# Patient Record
Sex: Female | Born: 1940 | Race: White | Hispanic: No | Smoking: Former smoker
Health system: Southern US, Community
[De-identification: ages and names within clinical notes are randomized; demographics above are authoritative.]

## PROBLEM LIST (undated history)

## (undated) DIAGNOSIS — G479 Sleep disorder, unspecified: Secondary | ICD-10-CM

## (undated) DIAGNOSIS — Z853 Personal history of malignant neoplasm of breast: Secondary | ICD-10-CM

## (undated) DIAGNOSIS — I10 Essential (primary) hypertension: Secondary | ICD-10-CM

## (undated) DIAGNOSIS — F419 Anxiety disorder, unspecified: Secondary | ICD-10-CM

## (undated) HISTORY — PX: TONSILLECTOMY: SUR1361

## (undated) HISTORY — DX: Essential (primary) hypertension: I10

## (undated) HISTORY — PX: BREAST LUMPECTOMY: SHX2

## (undated) HISTORY — DX: Anxiety disorder, unspecified: F41.9

## (undated) HISTORY — DX: Sleep disorder, unspecified: G47.9

## (undated) HISTORY — DX: Personal history of malignant neoplasm of breast: Z85.3

## (undated) HISTORY — PX: CHOLECYSTECTOMY: SHX55

---

## 1994-09-23 HISTORY — PX: BREAST LUMPECTOMY: SHX2

## 1997-04-23 HISTORY — PX: LEEP: SHX91

## 2001-09-23 DIAGNOSIS — K5190 Ulcerative colitis, unspecified, without complications: Secondary | ICD-10-CM

## 2001-09-23 HISTORY — DX: Ulcerative colitis, unspecified, without complications: K51.90

## 2001-09-23 HISTORY — PX: INGUINAL HERNIA REPAIR: SUR1180

## 2019-06-29 ENCOUNTER — Encounter: Payer: Self-pay | Admitting: Specialist

## 2019-09-28 ENCOUNTER — Encounter: Payer: Self-pay | Admitting: Internal Medicine

## 2019-09-28 ENCOUNTER — Ambulatory Visit (INDEPENDENT_AMBULATORY_CARE_PROVIDER_SITE_OTHER): Payer: Medicare Other | Admitting: Internal Medicine

## 2019-09-28 ENCOUNTER — Other Ambulatory Visit: Payer: Self-pay

## 2019-09-28 VITALS — BP 124/86 | HR 86 | Temp 98.0°F | Ht 68.0 in | Wt 132.0 lb

## 2019-09-28 DIAGNOSIS — Z7189 Other specified counseling: Secondary | ICD-10-CM | POA: Insufficient documentation

## 2019-09-28 DIAGNOSIS — Z853 Personal history of malignant neoplasm of breast: Secondary | ICD-10-CM

## 2019-09-28 DIAGNOSIS — I10 Essential (primary) hypertension: Secondary | ICD-10-CM | POA: Insufficient documentation

## 2019-09-28 DIAGNOSIS — Z0000 Encounter for general adult medical examination without abnormal findings: Secondary | ICD-10-CM

## 2019-09-28 DIAGNOSIS — R4189 Other symptoms and signs involving cognitive functions and awareness: Secondary | ICD-10-CM | POA: Diagnosis not present

## 2019-09-28 DIAGNOSIS — Z1211 Encounter for screening for malignant neoplasm of colon: Secondary | ICD-10-CM

## 2019-09-28 DIAGNOSIS — G479 Sleep disorder, unspecified: Secondary | ICD-10-CM

## 2019-09-28 LAB — COMPREHENSIVE METABOLIC PANEL
ALT: 7 U/L (ref 0–35)
AST: 22 U/L (ref 0–37)
Albumin: 4.3 g/dL (ref 3.5–5.2)
Alkaline Phosphatase: 64 U/L (ref 39–117)
BUN: 11 mg/dL (ref 6–23)
CO2: 29 mEq/L (ref 19–32)
Calcium: 9.4 mg/dL (ref 8.4–10.5)
Chloride: 99 mEq/L (ref 96–112)
Creatinine, Ser: 0.76 mg/dL (ref 0.40–1.20)
GFR: 73.12 mL/min (ref >60.00)
Glucose, Bld: 93 mg/dL (ref 70–99)
Potassium: 4.8 mEq/L (ref 3.5–5.1)
Sodium: 136 mEq/L (ref 135–145)
Total Bilirubin: 0.4 mg/dL (ref 0.2–1.2)
Total Protein: 6.9 g/dL (ref 6.0–8.3)

## 2019-09-28 LAB — CBC
HCT: 37.6 % (ref 36.0–46.0)
Hemoglobin: 12.6 g/dL (ref 12.0–15.0)
MCHC: 33.3 g/dL (ref 30.0–36.0)
MCV: 94.4 fl (ref 78.0–100.0)
Platelets: 261 10*3/uL (ref 150.0–400.0)
RBC: 3.99 Mil/uL (ref 3.87–5.11)
RDW: 14.2 % (ref 11.5–15.5)
WBC: 6.1 10*3/uL (ref 4.0–10.5)

## 2019-09-28 LAB — T4, FREE: Free T4: 0.98 ng/dL (ref 0.60–1.60)

## 2019-09-28 LAB — VITAMIN B12: Vitamin B-12: 786 pg/mL (ref 211–911)

## 2019-09-28 NOTE — Assessment & Plan Note (Signed)
BP Readings from Last 3 Encounters:  09/28/19 124/86   Not sure of her dose--daughter will send me records of this No apparent problems on the medication  Will check labs today--especially with the cognitive issues

## 2019-09-28 NOTE — Assessment & Plan Note (Signed)
Over 20 years ago Lumpectomy and RT Has decided against continuing mammograms

## 2019-09-28 NOTE — Assessment & Plan Note (Signed)
Sleeps well on the medication Discussed concerns about the mirtazapine affecting her Has been sleeping well here Discussed trying off the mirtazapine---try every other night for 1-2 weeks, and then stop

## 2019-09-28 NOTE — Patient Instructions (Signed)
Please try off the mirtazapine----use it every other night for 1-2 weeks, and then stop it if you are still sleeping well.

## 2019-09-28 NOTE — Assessment & Plan Note (Signed)
Last colon over 10 years ago--higher risk due to breast cancer. Will check FIT Will get records about vaccines No mammograms now

## 2019-09-28 NOTE — Progress Notes (Addendum)
Subjective:    Patient ID: Vicki Randolph, female    DOB: April 06, 1941, 79 y.o.   MRN: [id number]  HPI Here to establish care---with daughter Jeannene Patella  This visit occurred during the SARS-CoV-2 public health emergency.  Safety protocols were in place, including screening questions prior to the visit, additional usage of staff PPE, and extensive cleaning of exam room while observing appropriate contact time as indicated for disinfecting solutions.   Lives at [facility] one of the [place] here a month ago---from [place]  Has HTN since 1987 Has been on lisinopril for many years No orthostatic dizziness Regularly exercise--mostly walks (gym in the past)  Uses the mirtazapine for sleep--not sure of the dose Daughter notes some cognitive changes lately--"not connecting the dots" as well  Past breast cancer Lumpectomy and RT No mammograms in past 2-3 years Colonoscopy about 15 years ago  Current Outpatient Medications on File Prior to Visit  Medication Sig Dispense Refill  . lisinopril (ZESTRIL) 30 MG tablet Take 30 mg by mouth daily.    Marland Kitchen MIRTAZAPINE PO Take by mouth. 1/2 tablet at bedtime    . Multiple Vitamin (MULTIVITAMIN) capsule Take 1 capsule by mouth daily.     No current facility-administered medications on file prior to visit.    No Known Allergies  Past Medical History:  Diagnosis Date  . Anxiety   . History of breast cancer   . Hypertension   . Sleep disturbance   . Ulcerative colitis ([place]) 2003    Past Surgical History:  Procedure Laterality Date  . BREAST LUMPECTOMY Right 1990's  . BREAST LUMPECTOMY Right 1996  . CHOLECYSTECTOMY    . INGUINAL HERNIA REPAIR  2003   strangulated  . LEEP  04/1997   with cervical dilation  . TONSILLECTOMY      Family History  Problem Relation Age of Onset  . Heart disease Mother   . Alcohol abuse Father   . Alcohol abuse Sister   . Cancer Sister   . COPD Sister     Social History   Socioeconomic History    . Marital status: Divorced    Spouse name: Not on file  . Number of children: 3  . Years of education: Not on file  . Highest education level: Not on file  Occupational History  . Occupation: Homemaker  Tobacco Use  . Smoking status: Former Smoker    Packs/day: 1.00    Years: 30.00    Pack years: 30.00    Types: Cigarettes    Start date: 61    Quit date: 1980    Years since quitting: 41.3  . Smokeless tobacco: Never Used  Substance and Sexual Activity  . Alcohol use: Not on file  . Drug use: Not on file  . Sexual activity: Not on file  Other Topics Concern  . Not on file  Social History Narrative   Divorced many years ago   Worked briefly at several jobs--mostly homemaker   3 children      Has living will   Children share [facility] POA   Would accept resuscitation but no prolonged ventilation or tube feeds   Social Determinants of Health   Financial Resource Strain:   . Difficulty of Paying Living Expenses:   Food Insecurity:   . Worried About Charity fundraiser in the Last Year:   . Arboriculturist in the Last Year:   Transportation Needs:   . Film/video editor (Medical):   Marland Kitchen Lack  of Transportation (Non-Medical):   Physical Activity:   . Days of Exercise per Week:   . Minutes of Exercise per Session:   Stress:   . Feeling of Stress :   Social Connections:   . Frequency of Communication with Friends and Family:   . Frequency of Social Gatherings with Friends and Family:   . Attends Religious Services:   . Active Member of Clubs or Organizations:   . Attends Banker Meetings:   Marland Kitchen Marital Status:   Intimate Partner Violence:   . Fear of Current or Ex-Partner:   . Emotionally Abused:   Marland Kitchen Physically Abused:   . Sexually Abused:     Review of Systems Appetite is fine Weight is down now from her usual Hearing seems not as good per daughter Wears seat belt Teeth are okay    Objective:   Physical Exam  Constitutional: She is oriented to  person, place, and time. She appears well-developed. No distress.  Neck: No thyromegaly present.  Cardiovascular: Normal rate, regular rhythm, normal heart sounds and intact distal pulses. Exam reveals no gallop.  No murmur heard. Respiratory: Effort normal and breath sounds normal. No respiratory distress. She has no wheezes. She has no rales.  GI: Soft. There is no abdominal tenderness.  Musculoskeletal:        General: No tenderness or edema.  Lymphadenopathy:    She has no cervical adenopathy.  Neurological: She is alert and oriented to person, place, and time.  President--- "Trump, ?" [id number] Recall 2/3  Skin: No rash noted. No erythema.  Psychiatric: She has a normal mood and affect. Her behavior is normal.           Assessment & Plan:

## 2019-09-28 NOTE — Assessment & Plan Note (Addendum)
Having some troubles per mom Patient is somewhat defensive--but does notice minor issues (and has trouble remembering doses, etc) Clearly has had some stress with move No regular depression  Doesn't feel anxious now--did have in the past Needs hearing evaluation--and aides prn Will recheck in 3-6 months

## 2019-11-05 ENCOUNTER — Encounter: Payer: Self-pay | Admitting: Internal Medicine

## 2020-01-12 ENCOUNTER — Encounter: Payer: Self-pay | Admitting: Internal Medicine

## 2020-01-12 ENCOUNTER — Other Ambulatory Visit: Payer: Self-pay

## 2020-01-12 ENCOUNTER — Ambulatory Visit (INDEPENDENT_AMBULATORY_CARE_PROVIDER_SITE_OTHER): Payer: Medicare Other | Admitting: Internal Medicine

## 2020-01-12 DIAGNOSIS — I10 Essential (primary) hypertension: Secondary | ICD-10-CM

## 2020-01-12 DIAGNOSIS — F39 Unspecified mood [affective] disorder: Secondary | ICD-10-CM | POA: Diagnosis not present

## 2020-01-12 DIAGNOSIS — R4189 Other symptoms and signs involving cognitive functions and awareness: Secondary | ICD-10-CM | POA: Diagnosis not present

## 2020-01-12 NOTE — Assessment & Plan Note (Signed)
Now with depression since cutting back on the mirtazapine Will go back to the 30mg  at night Recheck 1 month Consider adding another agent if persists Restart exercise Needs social engagement

## 2020-01-12 NOTE — Assessment & Plan Note (Signed)
BP Readings from Last 3 Encounters:  01/12/20 (!) 144/86  09/28/19 124/86   Seems to be okay with just the low dose lisinopril Will continue this

## 2020-01-12 NOTE — Patient Instructions (Signed)
Please go back up to a full mirtazapine 30mg  every night

## 2020-01-12 NOTE — Progress Notes (Signed)
Subjective:    Patient ID: Vicki Randolph, female    DOB: 1941-02-08, 79 y.o.   MRN: [id number]  HPI Here with daughter Jeannene Patella for review of cognitive issues This visit occurred during the SARS-CoV-2 public health emergency.  Safety protocols were in place, including screening questions prior to the visit, additional usage of staff PPE, and extensive cleaning of exam room while observing appropriate contact time as indicated for disinfecting solutions.   She feels now that she is depressed Not really exercising much Has had past depression---treated in the past (likely 30 or more years ago) Notes anhedonia Daily sad feeling--seems more since she moved  Not hopeless but cries at times Thinks about all her lost relatives Thinks about dying but no suicidal thoughts  Sleeps well on the mirtazapine Has been taking a full mirtazapine before--but we cut that back  She doesn't see any cognitive changes Pam notes that she is not any better  Current Outpatient Medications on File Prior to Visit  Medication Sig Dispense Refill  . lisinopril (ZESTRIL) 10 MG tablet Take 10 mg by mouth daily.    . mirtazapine (REMERON) 30 MG tablet Take 15 mg by mouth at bedtime.    . Multiple Vitamin (MULTIVITAMIN) capsule Take 1 capsule by mouth daily.     No current facility-administered medications on file prior to visit.    No Known Allergies  Past Medical History:  Diagnosis Date  . Anxiety   . History of breast cancer   . Hypertension   . Sleep disturbance   . Ulcerative colitis ([place]) 2003    Past Surgical History:  Procedure Laterality Date  . BREAST LUMPECTOMY Right 1990's  . BREAST LUMPECTOMY Right 1996  . CHOLECYSTECTOMY    . INGUINAL HERNIA REPAIR  2003   strangulated  . LEEP  04/1997   with cervical dilation  . TONSILLECTOMY      Family History  Problem Relation Age of Onset  . Heart disease Mother   . Alcohol abuse Father   . Alcohol abuse Sister   . Cancer Sister   .  COPD Sister     Social History   Socioeconomic History  . Marital status: Divorced    Spouse name: Not on file  . Number of children: 3  . Years of education: Not on file  . Highest education level: Not on file  Occupational History  . Occupation: Homemaker  Tobacco Use  . Smoking status: Former Smoker    Packs/day: 1.00    Years: 30.00    Pack years: 30.00    Types: Cigarettes    Start date: 58    Quit date: 1980    Years since quitting: 41.3  . Smokeless tobacco: Never Used  Substance and Sexual Activity  . Alcohol use: Not on file  . Drug use: Not on file  . Sexual activity: Not on file  Other Topics Concern  . Not on file  Social History Narrative   Divorced many years ago   Worked briefly at several jobs--mostly homemaker   3 children      Has living will   Children share [facility] POA   Would accept resuscitation but no prolonged ventilation or tube feeds   Social Determinants of Health   Financial Resource Strain:   . Difficulty of Paying Living Expenses:   Food Insecurity:   . Worried About Charity fundraiser in the Last Year:   . [place] in the Last Year:  Transportation Needs:   . Freight forwarder (Medical):   Marland Kitchen Lack of Transportation (Non-Medical):   Physical Activity:   . Days of Exercise per Week:   . Minutes of Exercise per Session:   Stress:   . Feeling of Stress :   Social Connections:   . Frequency of Communication with Friends and Family:   . Frequency of Social Gatherings with Friends and Family:   . Attends Religious Services:   . Active Member of Clubs or Organizations:   . Attends Banker Meetings:   Marland Kitchen Marital Status:   Intimate Partner Violence:   . Fear of Current or Ex-Partner:   . Emotionally Abused:   Marland Kitchen Physically Abused:   . Sexually Abused:    Review of Systems Appetite is "fine"---weight down slightly Not cooking much (she just doesn't) Has hearing aides but forgets to put them in      Objective:   Physical Exam  Constitutional: No distress.  Psychiatric:  Mild depression           Assessment & Plan:

## 2020-01-12 NOTE — Assessment & Plan Note (Signed)
Persists but not clearly worse May want to consider brain MRI and neurology evaluation Will hold off on that

## 2020-02-18 ENCOUNTER — Ambulatory Visit: Payer: Medicare Other | Admitting: Internal Medicine

## 2020-02-22 ENCOUNTER — Ambulatory Visit: Payer: Medicare Other | Admitting: Internal Medicine

## 2020-02-23 ENCOUNTER — Other Ambulatory Visit: Payer: Self-pay

## 2020-02-23 ENCOUNTER — Ambulatory Visit: Payer: Medicare Other | Admitting: Internal Medicine

## 2020-03-01 ENCOUNTER — Encounter: Payer: Self-pay | Admitting: Internal Medicine

## 2020-03-01 ENCOUNTER — Other Ambulatory Visit: Payer: Self-pay

## 2020-03-01 ENCOUNTER — Ambulatory Visit (INDEPENDENT_AMBULATORY_CARE_PROVIDER_SITE_OTHER): Payer: Medicare Other | Admitting: Internal Medicine

## 2020-03-01 DIAGNOSIS — F39 Unspecified mood [affective] disorder: Secondary | ICD-10-CM

## 2020-03-01 DIAGNOSIS — G479 Sleep disorder, unspecified: Secondary | ICD-10-CM | POA: Diagnosis not present

## 2020-03-01 NOTE — Progress Notes (Signed)
Subjective:    Patient ID: Vicki Randolph, female    DOB: [birth year], 79 y.o.   MRN: [id number]  HPI Here with daughter for follow up of depression and sleep problems This visit occurred during the SARS-CoV-2 public health emergency.  Safety protocols were in place, including screening questions prior to the visit, additional usage of staff PPE, and extensive cleaning of exam room while observing appropriate contact time as indicated for disinfecting solutions.  Still not sleeping well Daughter "is the only thing in the world to be grateful for" Regular bedtime is 10PM---now pushed to 11PM Usually awakens 2:30-3AM and is up---- but stays in bed till 7AM No napping Some daytime tiredness---but no sleep pressure  She is unclear about whether she is taking 1/2 or full mirtazapine She doesn't note depression like last time  She notes some forgetfulness now  Current Outpatient Medications on File Prior to Visit  Medication Sig Dispense Refill  . lisinopril (ZESTRIL) 10 MG tablet Take 10 mg by mouth daily.    . mirtazapine (REMERON) 30 MG tablet Take 30 mg by mouth at bedtime. Pt alternates with 1/2 or whole tablet    . Multiple Vitamin (MULTIVITAMIN) capsule Take 1 capsule by mouth daily.     No current facility-administered medications on file prior to visit.    No Known Allergies  Past Medical History:  Diagnosis Date  . Anxiety   . History of breast cancer   . Hypertension   . Sleep disturbance   . Ulcerative colitis ([facility]) 2003    Past Surgical History:  Procedure Laterality Date  . BREAST LUMPECTOMY Right 1990's  . BREAST LUMPECTOMY Right 1996  . CHOLECYSTECTOMY    . INGUINAL HERNIA REPAIR  2003   strangulated  . LEEP  04/1997   with cervical dilation  . TONSILLECTOMY      Family History  Problem Relation Age of Onset  . Heart disease Mother   . Alcohol abuse Father   . Alcohol abuse Sister   . Cancer Sister   . COPD Sister     Social History    Socioeconomic History  . Marital status: Divorced    Spouse name: Not on file  . Number of children: 3  . Years of education: Not on file  . Highest education level: Not on file  Occupational History  . Occupation: Homemaker  Tobacco Use  . Smoking status: Former Smoker    Packs/day: 1.00    Years: 30.00    Pack years: 30.00    Types: Cigarettes    Start date: 48    Quit date: 1980    Years since quitting: 41.4  . Smokeless tobacco: Never Used  Substance and Sexual Activity  . Alcohol use: Not on file  . Drug use: Not on file  . Sexual activity: Not on file  Other Topics Concern  . Not on file  Social History Narrative   Divorced many years ago   Worked briefly at several jobs--mostly homemaker   3 children      Has living will   Children share [facility] POA   Would accept resuscitation but no prolonged ventilation or tube feeds   Social Determinants of Health   Financial Resource Strain:   . Difficulty of Paying Living Expenses:   Food Insecurity:   . Worried About Programme researcher, broadcasting/film/video in the Last Year:   . [employer] of Food in the Last Year:   Transportation Needs:   . Lack of  Transportation (Medical):   Marland Kitchen Lack of Transportation (Non-Medical):   Physical Activity:   . Days of Exercise per Week:   . Minutes of Exercise per Session:   Stress:   . Feeling of Stress :   Social Connections:   . Frequency of Communication with Friends and Family:   . Frequency of Social Gatherings with Friends and Family:   . Attends Religious Services:   . Active Member of Clubs or Organizations:   . Attends Archivist Meetings:   Marland Kitchen Marital Status:   Intimate Partner Violence:   . Fear of Current or Ex-Partner:   . Emotionally Abused:   Marland Kitchen Physically Abused:   . Sexually Abused:    Review of Systems Appetite is okay Weight is stable No snoring or apnea No caffeine after morning coffee No edema Nocturia x 2-3--usually can get back to sleep    Objective:    Physical Exam  Constitutional: She appears well-developed. No distress.  Psychiatric: She has a normal mood and affect. Her behavior is normal.           Assessment & Plan:

## 2020-03-01 NOTE — Assessment & Plan Note (Addendum)
No daytime somnolence but some energy issues Discussed sleep hygiene---- try compressing sleep (get up 7AM but go to sleep later) Now will start exercise at the gym and more social interaction Going to the mass service at [facility] For now, will avoid other medications  Does have trazodone from her past physician She doesn't remember if that helped Okay to try that

## 2020-03-01 NOTE — Assessment & Plan Note (Signed)
Not really feeling depressed now Will continue the mirtazapine

## 2020-03-01 NOTE — Patient Instructions (Signed)
You can try the trazodone 50mg  at bedtime to see if that helps. Go ahead and start in the gym and increase your social interactions. Continue to get up at 7AM every morning. Stop listening to the news at night--music is okay Go to bed later--like midnight (if you sleep all the way to 7AM, you can start going to bed earlier)

## 2020-06-08 ENCOUNTER — Ambulatory Visit: Payer: Medicare Other | Admitting: Internal Medicine

## 2020-10-11 ENCOUNTER — Telehealth: Payer: Self-pay

## 2020-10-11 NOTE — Telephone Encounter (Signed)
Called and left voicemail for patient to call office back. Dr. Alphonsus Sias was wanting to follow-up and see how she was doing.

## 2020-10-12 ENCOUNTER — Ambulatory Visit (INDEPENDENT_AMBULATORY_CARE_PROVIDER_SITE_OTHER): Payer: Medicare Other | Admitting: Internal Medicine

## 2020-10-12 ENCOUNTER — Other Ambulatory Visit: Payer: Self-pay

## 2020-10-12 ENCOUNTER — Encounter: Payer: Self-pay | Admitting: Internal Medicine

## 2020-10-12 VITALS — BP 136/84 | HR 87 | Temp 97.6°F | Ht 68.0 in | Wt 133.0 lb

## 2020-10-12 DIAGNOSIS — F39 Unspecified mood [affective] disorder: Secondary | ICD-10-CM

## 2020-10-12 DIAGNOSIS — R413 Other amnesia: Secondary | ICD-10-CM

## 2020-10-12 DIAGNOSIS — G3184 Mild cognitive impairment, so stated: Secondary | ICD-10-CM | POA: Insufficient documentation

## 2020-10-12 DIAGNOSIS — G459 Transient cerebral ischemic attack, unspecified: Secondary | ICD-10-CM | POA: Insufficient documentation

## 2020-10-12 NOTE — Patient Instructions (Signed)
Please start taking a coated 81mg  aspirin daily. We will reconsider this after the tests.

## 2020-10-12 NOTE — Assessment & Plan Note (Addendum)
Period of 2-3 hours of expressive aphasia Clear but illogical speech that cleared No deficit now Will check MRI and carotids She is reluctant to take any medication for this--but will start ASA 81mg  for now Will plan surgical evaluation if major carotid obstruction

## 2020-10-12 NOTE — Progress Notes (Signed)
Subjective:    Patient ID: Vicki Randolph, female    DOB: 02/13/1941, 80 y.o.   MRN: [id number]  HPI Here with daughter Elita Quick due to concerns about cognitive changes This visit occurred during the SARS-CoV-2 public health emergency.  Safety protocols were in place, including screening questions prior to the visit, additional usage of staff PPE, and extensive cleaning of exam room while observing appropriate contact time as indicated for disinfecting solutions.   Daughter on the phone with her 2 evenings ago She was saying words "that didn't connect in any coherent way" Had been fine on the phone that morning Daughter and her husband drove over to check on her (patient doesn't like intrusions so she didn't call EMTs) She was on speaker phone and SIL noticed also Got there about 30 minutes later Still not speaking correctly-but was some better  Abnormal speech was also noted by a granddaughter This happened soon before the call with daughter Kept saying "I'm paying attention....you know, high school, yeah paying attention"---etc She was upset with daughter for showing up---worries because daughter has mentioned concerns with dementia--but this was different Took her home for the night---and seemed fine  Ongoing issues with forgetfulness--but this was very different  No focal weakness No facial droop Did decide to stop driving some time ago  Still depressed at times Goes to mass weekly--with group of women Services at [facility] also--and spiritual discussion group To lunch with women at times  Still having sleep problems Continues on the mirtazapine  Slept better at daughter's house  Current Outpatient Medications on File Prior to Visit  Medication Sig Dispense Refill  . lisinopril (ZESTRIL) 10 MG tablet Take 10 mg by mouth daily.    . mirtazapine (REMERON) 30 MG tablet Take 30 mg by mouth at bedtime. Pt alternates with 1/2 or whole tablet    . Multiple Vitamin  (MULTIVITAMIN) capsule Take 1 capsule by mouth daily.     No current facility-administered medications on file prior to visit.    No Known Allergies  Past Medical History:  Diagnosis Date  . Anxiety   . History of breast cancer   . Hypertension   . Sleep disturbance   . Ulcerative colitis ([facility]) 2003    Past Surgical History:  Procedure Laterality Date  . BREAST LUMPECTOMY Right 1990's  . BREAST LUMPECTOMY Right 1996  . CHOLECYSTECTOMY    . INGUINAL HERNIA REPAIR  2003   strangulated  . LEEP  04/1997   with cervical dilation  . TONSILLECTOMY      Family History  Problem Relation Age of Onset  . Heart disease Mother   . Alcohol abuse Father   . Alcohol abuse Sister   . Cancer Sister   . COPD Sister     Social History   Socioeconomic History  . Marital status: Divorced    Spouse name: Not on file  . Number of children: 3  . Years of education: Not on file  . Highest education level: Not on file  Occupational History  . Occupation: Homemaker  Tobacco Use  . Smoking status: Former Smoker    Packs/day: 1.00    Years: 30.00    Pack years: 30.00    Types: Cigarettes    Start date: 56    Quit date: 1980    Years since quitting: 42.0  . Smokeless tobacco: Never Used  Substance and Sexual Activity  . Alcohol use: Not on file  . Drug use: Not on file  .  Sexual activity: Not on file  Other Topics Concern  . Not on file  Social History Narrative   Divorced many years ago   Worked briefly at several jobs--mostly homemaker   3 children      Has living will   Children share [facility] POA   Would accept resuscitation but no prolonged ventilation or tube feeds   Social Determinants of Health   Financial Resource Strain: Not on file  Food Insecurity: Not on file  Transportation Needs: Not on file  Physical Activity: Not on file  Stress: Not on file  Social Connections: Not on file  Intimate Partner Violence: Not on file   Review of Systems Not sick No  fevers No dysuria Eating well---goes weekly to shop with [employer] Doing housework No cough or SOB     Objective:   Physical Exam Constitutional:      Appearance: Normal appearance.  Cardiovascular:     Rate and Rhythm: Normal rate and regular rhythm.     Heart sounds: No murmur heard. No gallop.   Pulmonary:     Effort: Pulmonary effort is normal.     Breath sounds: Normal breath sounds. No wheezing or rales.  Musculoskeletal:     Cervical back: Neck supple.  Lymphadenopathy:     Cervical: No cervical adenopathy.  Neurological:     Mental Status: She is alert and oriented to person, place, and time.     Cranial Nerves: Cranial nerves are intact.     Motor: Motor function is intact.     Coordination: Coordination is intact. Romberg sign negative. Coordination normal.     Gait: Gait is intact.  Psychiatric:     Comments: Resistant to daughter's descriptions and seems to have some degree of denial            Assessment & Plan:

## 2020-10-12 NOTE — Assessment & Plan Note (Signed)
Ongoing mild depression Will continue the mirtazapine

## 2020-10-12 NOTE — Assessment & Plan Note (Signed)
Has had some cognitive changes over time Will check labs

## 2020-10-13 LAB — COMPREHENSIVE METABOLIC PANEL
ALT: 4 U/L (ref 0–35)
AST: 15 U/L (ref 0–37)
Albumin: 4.3 g/dL (ref 3.5–5.2)
Alkaline Phosphatase: 68 U/L (ref 39–117)
BUN: 17 mg/dL (ref 6–23)
CO2: 29 mEq/L (ref 19–32)
Calcium: 9.3 mg/dL (ref 8.4–10.5)
Chloride: 99 mEq/L (ref 96–112)
Creatinine, Ser: 0.7 mg/dL (ref 0.40–1.20)
GFR: 81.01 mL/min (ref >60.00)
Glucose, Bld: 93 mg/dL (ref 70–99)
Potassium: 5.1 mEq/L (ref 3.5–5.1)
Sodium: 134 mEq/L — ABNORMAL LOW (ref 135–145)
Total Bilirubin: 0.3 mg/dL (ref 0.2–1.2)
Total Protein: 6.5 g/dL (ref 6.0–8.3)

## 2020-10-13 LAB — VITAMIN B12: Vitamin B-12: 579 pg/mL (ref 211–911)

## 2020-10-13 LAB — LIPID PANEL
Cholesterol: 222 mg/dL — ABNORMAL HIGH (ref 0–200)
HDL: 55.9 mg/dL (ref >39.00)
LDL Cholesterol: 140 mg/dL — ABNORMAL HIGH (ref 0–99)
NonHDL: 165.8
Total CHOL/HDL Ratio: 4
Triglycerides: 131 mg/dL (ref 0.0–149.0)
VLDL: 26.2 mg/dL (ref 0.0–40.0)

## 2020-10-13 LAB — CBC
HCT: 36.1 % (ref 36.0–46.0)
Hemoglobin: 12.1 g/dL (ref 12.0–15.0)
MCHC: 33.4 g/dL (ref 30.0–36.0)
MCV: 93.4 fl (ref 78.0–100.0)
Platelets: 305 10*3/uL (ref 150.0–400.0)
RBC: 3.87 Mil/uL (ref 3.87–5.11)
RDW: 13.8 % (ref 11.5–15.5)
WBC: 7.4 10*3/uL (ref 4.0–10.5)

## 2020-10-13 LAB — T4, FREE: Free T4: 0.89 ng/dL (ref 0.60–1.60)

## 2020-10-13 NOTE — Addendum Note (Signed)
Addended by: Maisie Fus on: 10/13/2020 05:16 PM   Modules accepted: Orders

## 2020-10-14 ENCOUNTER — Ambulatory Visit (HOSPITAL_COMMUNITY): Admission: RE | Admit: 2020-10-14 | Payer: Medicare Other | Source: Ambulatory Visit

## 2020-10-16 ENCOUNTER — Telehealth: Payer: Self-pay

## 2020-10-16 NOTE — Telephone Encounter (Signed)
After I called and left a message found out that per daughter they want to wait on MRI and proceed with [place].  I scheduled patient with [facility] in [place] for tomorrow-10/17/20 at 1 pm to arrive at 12:45 pm. Need to notify daughter still.  Address: [address], [place], [place] [address]  Phone: [phone]

## 2020-10-16 NOTE — Telephone Encounter (Signed)
Okay Will await the report tomorrow

## 2020-10-16 NOTE — Telephone Encounter (Signed)
Spoke with Daughter - Rinaldo Cloud - she is aware of Carotid US appt for patient scheduled on 10/17/20 at [facility] [address].  Will send to Dr Alphonsus Sias as [place] Childes that were able to get her scheduled.   Nothing further needed.

## 2020-10-16 NOTE — Addendum Note (Signed)
Addended by: Maisie Fus on: 10/16/2020 11:58 AM   Modules accepted: Orders

## 2020-10-16 NOTE — Telephone Encounter (Signed)
Left message for patient's daughter, Rinaldo Cloud, to call back to discuss when she can take patient for MRI and Carotid Ultrasound before I call and schedule.

## 2020-10-17 ENCOUNTER — Ambulatory Visit (INDEPENDENT_AMBULATORY_CARE_PROVIDER_SITE_OTHER): Payer: Medicare Other

## 2020-10-17 ENCOUNTER — Other Ambulatory Visit: Payer: Self-pay

## 2020-10-17 DIAGNOSIS — G459 Transient cerebral ischemic attack, unspecified: Secondary | ICD-10-CM | POA: Diagnosis not present

## 2020-10-17 NOTE — Telephone Encounter (Signed)
Pt's daughter called to check status of MRI, spoke with [facility] and they were under the impression she wanted to hold off on MRI. Daughter said they didn't want to hold off it was just daughter had to attend a funeral on the Saturday they were trying to schedule and she though they were going to pick another day.  Checked with [facility] and she was on the phone so note sent, I advise daughter Rinaldo Cloud [facility] will call back to discuss MRI. CB # [id number], she said she had multiple days she can't take pt due to schedule so when [facility] calls she will just tell her the days that she can take pt. Daughter has a work Armed forces technical officer at [employer] so if she can't call back before then she requested [place] to call tomorrow

## 2020-10-18 NOTE — Telephone Encounter (Signed)
Spoke with patient's daughter Elita Quick, MRI scheduled based on her available dates and times. Also provided phone number for scheduling to [facility] in case she needs to reschedule.

## 2020-10-27 ENCOUNTER — Other Ambulatory Visit: Payer: Self-pay

## 2020-10-27 ENCOUNTER — Other Ambulatory Visit: Payer: Self-pay | Admitting: Internal Medicine

## 2020-10-27 ENCOUNTER — Ambulatory Visit
Admission: RE | Admit: 2020-10-27 | Discharge: 2020-10-27 | Disposition: A | Discharge status: Home / Self Care | Payer: Medicare Other | Source: Ambulatory Visit | Attending: Internal Medicine | Admitting: Internal Medicine

## 2020-10-27 DIAGNOSIS — R413 Other amnesia: Secondary | ICD-10-CM | POA: Diagnosis present

## 2020-10-27 DIAGNOSIS — G459 Transient cerebral ischemic attack, unspecified: Secondary | ICD-10-CM | POA: Diagnosis present

## 2020-11-01 ENCOUNTER — Ambulatory Visit (INDEPENDENT_AMBULATORY_CARE_PROVIDER_SITE_OTHER): Payer: Medicare Other | Admitting: Internal Medicine

## 2020-11-01 ENCOUNTER — Encounter: Payer: Self-pay | Admitting: Internal Medicine

## 2020-11-01 ENCOUNTER — Other Ambulatory Visit: Payer: Self-pay

## 2020-11-01 DIAGNOSIS — I10 Essential (primary) hypertension: Secondary | ICD-10-CM

## 2020-11-01 DIAGNOSIS — G459 Transient cerebral ischemic attack, unspecified: Secondary | ICD-10-CM | POA: Diagnosis not present

## 2020-11-01 MED ORDER — ROSUVASTATIN CALCIUM 20 MG PO TABS: 20.0000 mg | ORAL_TABLET | ORAL | 3 refills | Status: DC

## 2020-11-01 NOTE — Assessment & Plan Note (Signed)
She asserts this is volitional but daughter is sure it is not Carotids normal and MRI shows only mild vascular changes Asked her to take the ASA daily Urged her to accept statin--will try just weekly

## 2020-11-01 NOTE — Progress Notes (Signed)
Subjective:    Patient ID: Vicki Randolph, female    DOB: December 03, 1940, 80 y.o.   MRN: [id number]  HPI Here with daughter to review MRI and TIA follow up This visit occurred during the SARS-CoV-2 public health emergency.  Safety protocols were in place, including screening questions prior to the visit, additional usage of staff PPE, and extensive cleaning of exam room while observing appropriate contact time as indicated for disinfecting solutions.   No further symptoms She is now asserting that her speech problems were on purpose She now says she won't do that again --to "not put my daughter through that again"  They did buy aspirin after my visit--but she hasn't started it  Current Outpatient Medications on File Prior to Visit  Medication Sig Dispense Refill  . aspirin EC 81 MG tablet Take 81 mg by mouth daily. Swallow whole.    . lisinopril (ZESTRIL) 10 MG tablet TAKE 1 TABLET BY MOUTH TWICE DAILY 180 tablet 3  . mirtazapine (REMERON) 30 MG tablet Take 30 mg by mouth at bedtime. Pt alternates with 1/2 or whole tablet    . Multiple Vitamin (MULTIVITAMIN) capsule Take 1 capsule by mouth daily.     No current facility-administered medications on file prior to visit.    No Known Allergies  Past Medical History:  Diagnosis Date  . Anxiety   . History of breast cancer   . Hypertension   . Sleep disturbance   . Ulcerative colitis (HCC) 2003    Past Surgical History:  Procedure Laterality Date  . BREAST LUMPECTOMY Right 1990's  . BREAST LUMPECTOMY Right 1996  . CHOLECYSTECTOMY    . INGUINAL HERNIA REPAIR  2003   strangulated  . LEEP  04/1997   with cervical dilation  . TONSILLECTOMY      Family History  Problem Relation Age of Onset  . Heart disease Mother   . Alcohol abuse Father   . Alcohol abuse Sister   . Cancer Sister   . COPD Sister     Social History   Socioeconomic History  . Marital status: Divorced    Spouse name: Not on file  . Number of  children: 3  . Years of education: Not on file  . Highest education level: Not on file  Occupational History  . Occupation: Homemaker  Tobacco Use  . Smoking status: Former Smoker    Packs/day: 1.00    Years: 30.00    Pack years: 30.00    Types: Cigarettes    Start date: 82    Quit date: 1980    Years since quitting: 42.1  . Smokeless tobacco: Never Used  Substance and Sexual Activity  . Alcohol use: Not on file  . Drug use: Not on file  . Sexual activity: Not on file  Other Topics Concern  . Not on file  Social History Narrative   Divorced many years ago   Worked briefly at several jobs--mostly homemaker   3 children      Has living will   Children share [facility] PO[facility]   Would accept resuscitation but no prolonged ventilation or tube feeds   Social Determinants of Health   Financial Resource Strain: Not on file  Food Insecurity: Not on file  Transportation Needs: Not on file  Physical Activity: Not on file  Stress: Not on file  Social Connections: Not on file  Intimate Partner Violence: Not on file   Review of Systems Not sleeping well--variable. Awakens ~5:30AM, goes to sleep ~9-10PM  Appetite is good Weight stable    Objective:   Physical Exam Constitutional:      Appearance: Normal appearance.  Neurological:     Mental Status: She is alert.            Assessment & Plan:

## 2020-11-01 NOTE — Assessment & Plan Note (Signed)
BP Readings from Last 3 Encounters:  11/01/20 (!) 172/100  10/12/20 136/84  03/01/20 128/80   Seems to be up due to visit Will have her check at home and let me know if over 150/90 at home

## 2020-12-07 ENCOUNTER — Encounter: Payer: Self-pay | Admitting: Internal Medicine

## 2021-05-15 ENCOUNTER — Ambulatory Visit: Payer: Medicare Other | Admitting: Internal Medicine

## 2021-05-29 ENCOUNTER — Other Ambulatory Visit: Payer: Self-pay

## 2021-05-29 ENCOUNTER — Encounter: Payer: Self-pay | Admitting: Internal Medicine

## 2021-05-29 ENCOUNTER — Ambulatory Visit (INDEPENDENT_AMBULATORY_CARE_PROVIDER_SITE_OTHER): Payer: Medicare Other | Admitting: Internal Medicine

## 2021-05-29 VITALS — BP 118/72 | HR 92 | Temp 97.7°F | Ht 67.5 in | Wt 128.0 lb

## 2021-05-29 DIAGNOSIS — G459 Transient cerebral ischemic attack, unspecified: Secondary | ICD-10-CM

## 2021-05-29 DIAGNOSIS — Z23 Encounter for immunization: Secondary | ICD-10-CM

## 2021-05-29 DIAGNOSIS — G3184 Mild cognitive impairment, so stated: Secondary | ICD-10-CM | POA: Diagnosis not present

## 2021-05-29 DIAGNOSIS — I10 Essential (primary) hypertension: Secondary | ICD-10-CM | POA: Diagnosis not present

## 2021-05-29 DIAGNOSIS — F39 Unspecified mood [affective] disorder: Secondary | ICD-10-CM

## 2021-05-29 DIAGNOSIS — Z0000 Encounter for general adult medical examination without abnormal findings: Secondary | ICD-10-CM | POA: Diagnosis not present

## 2021-05-29 DIAGNOSIS — R269 Unspecified abnormalities of gait and mobility: Secondary | ICD-10-CM | POA: Insufficient documentation

## 2021-05-29 LAB — COMPREHENSIVE METABOLIC PANEL
ALT: 4 U/L (ref 0–35)
AST: 13 U/L (ref 0–37)
Albumin: 3.9 g/dL (ref 3.5–5.2)
Alkaline Phosphatase: 57 U/L (ref 39–117)
BUN: 14 mg/dL (ref 6–23)
CO2: 29 mEq/L (ref 19–32)
Calcium: 9.2 mg/dL (ref 8.4–10.5)
Chloride: 104 mEq/L (ref 96–112)
Creatinine, Ser: 0.74 mg/dL (ref 0.40–1.20)
GFR: 75.45 mL/min (ref >60.00)
Glucose, Bld: 83 mg/dL (ref 70–99)
Potassium: 4.5 mEq/L (ref 3.5–5.1)
Sodium: 141 mEq/L (ref 135–145)
Total Bilirubin: 0.4 mg/dL (ref 0.2–1.2)
Total Protein: 6.4 g/dL (ref 6.0–8.3)

## 2021-05-29 LAB — LIPID PANEL
Cholesterol: 209 mg/dL — ABNORMAL HIGH (ref 0–200)
HDL: 64 mg/dL (ref >39.00)
LDL Cholesterol: 124 mg/dL — ABNORMAL HIGH (ref 0–99)
NonHDL: 144.7
Total CHOL/HDL Ratio: 3
Triglycerides: 102 mg/dL (ref 0.0–149.0)
VLDL: 20.4 mg/dL (ref 0.0–40.0)

## 2021-05-29 LAB — CBC
HCT: 38.3 % (ref 36.0–46.0)
Hemoglobin: 12.5 g/dL (ref 12.0–15.0)
MCHC: 32.7 g/dL (ref 30.0–36.0)
MCV: 95.3 fl (ref 78.0–100.0)
Platelets: 244 10*3/uL (ref 150.0–400.0)
RBC: 4.02 Mil/uL (ref 3.87–5.11)
RDW: 14 % (ref 11.5–15.5)
WBC: 6.8 10*3/uL (ref 4.0–10.5)

## 2021-05-29 NOTE — Progress Notes (Signed)
Subjective:    Patient ID: Vicki Randolph, female    DOB: Jun 07, 1941, 80 y.o.   MRN: [id number]  HPI Here with daughter Elita Quick for Medicare wellness visit and follow up of chronic health conditions This visit occurred during the SARS-CoV-2 public health emergency.  Safety protocols were in place, including screening questions prior to the visit, additional usage of staff PPE, and extensive cleaning of exam room while observing appropriate contact time as indicated for disinfecting solutions.   Reviewed advanced directives Reviewed other doctors----Dr Hecker--ophthal, Dr Allayne Butcher, Dr Pelletier--audiology No hospitalizations or surgery this year No alcohol or tobacco Vision is okay Hearing is not great---usually doesn't wear her aides No falls Chronic mood issues are controlled Doesn't drive. Does her instrumental ADLs Mild memory issues---no progression or worrisome feature  Has had some balance issues--some stumbling She states "I am not paying attention---not being careful" She does wear clogs, etc--that can slip Goes back months Walks unassisted Not generally orthostatic No sensory changes in feet No falls No leg weakness---hasn't been exercising  Still having sleep problems---had been doing okay for a while Some trouble just recently -- 1-2 weeks Still awakens feeling tired--better in afternoon Some mild depression--still feels limited without a car and having moved  No new neurologic symptoms  Current Outpatient Medications on File Prior to Visit  Medication Sig Dispense Refill   aspirin EC 81 MG tablet Take 81 mg by mouth daily. Swallow whole.     lisinopril (ZESTRIL) 10 MG tablet TAKE 1 TABLET BY MOUTH TWICE DAILY 180 tablet 3   mirtazapine (REMERON) 30 MG tablet Take 30 mg by mouth at bedtime. Pt alternates with 1/2 or whole tablet     Multiple Vitamin (MULTIVITAMIN) capsule Take 1 capsule by mouth daily.     rosuvastatin (CRESTOR) 20 MG tablet Take 1 tablet (20 mg  total) by mouth once a week. 13 tablet 3   No current facility-administered medications on file prior to visit.    Not on File  Past Medical History:  Diagnosis Date   Anxiety    History of breast cancer    Hypertension    Sleep disturbance    Ulcerative colitis (HCC) 2003    Past Surgical History:  Procedure Laterality Date   BREAST LUMPECTOMY Right 1990's   BREAST LUMPECTOMY Right 1996   CHOLECYSTECTOMY     INGUINAL HERNIA REPAIR  2003   strangulated   LEEP  04/1997   with cervical dilation   TONSILLECTOMY      Family History  Problem Relation Age of Onset   Heart disease Mother    Alcohol abuse Father    Alcohol abuse Sister    Cancer Sister    COPD Sister     Social History   Socioeconomic History   Marital status: Divorced    Spouse name: Not on file   Number of children: 3   Years of education: Not on file   Highest education level: Not on file  Occupational History   Occupation: Homemaker  Tobacco Use   Smoking status: Former    Packs/day: 1.00    Years: 30.00    Pack years: 30.00    Types: Cigarettes    Start date: 1950    Quit date: 1980    Years since quitting: 42.7   Smokeless tobacco: Never  Substance and Sexual Activity   Alcohol use: Not on file   Drug use: Not on file   Sexual activity: Not on file  Other Topics Concern  Not on file  Social History Narrative   Divorced many years ago   Worked briefly at several jobs--mostly homemaker   3 children      Has living will   Children share [facility] POA   Would accept resuscitation but no prolonged ventilation or tube feeds   Social Determinants of Health   Financial Resource Strain: Not on file  Food Insecurity: Not on file  Transportation Needs: Not on file  Physical Activity: Not on file  Stress: Not on file  Social Connections: Not on file  Intimate Partner Violence: Not on file   Review of Systems Appetite is okay Weight is stable Wears seat belt Hasn't seen derm---no  suspicious lesions Teeth okay---sees dentist No chest pain or SOB No dizziness or syncope No edema Rare feeling of palpitations (once a month or so--racing briefly) No heartburn or dysphagia Bowels move fine--no blood No dysuria or hematuria--no incontinence     Objective:   Physical Exam Constitutional:      Appearance: Normal appearance.  HENT:     Mouth/Throat:     Comments: No lesions Eyes:     Conjunctiva/sclera: Conjunctivae normal.     Pupils: Pupils are equal, round, and reactive to light.  Cardiovascular:     Rate and Rhythm: Normal rate and regular rhythm.     Pulses: Normal pulses.     Heart sounds: No murmur heard.   No gallop.  Pulmonary:     Effort: Pulmonary effort is normal.     Breath sounds: Normal breath sounds. No wheezing or rales.  Abdominal:     Palpations: Abdomen is soft.     Tenderness: There is no abdominal tenderness.  Musculoskeletal:     Cervical back: Neck supple.     Right lower leg: No edema.     Left lower leg: No edema.  Lymphadenopathy:     Cervical: No cervical adenopathy.  Skin:    General: Skin is warm.     Findings: No rash.  Neurological:     Mental Status: She is alert and oriented to person, place, and time.     Comments: President---"Biden, wasn't Bush---?" [id number] Recall 1/3  Gait is fairly normal--legs do partially cross over           Assessment & Plan:

## 2021-05-29 NOTE — Assessment & Plan Note (Signed)
No functional decline Discussed exercise and social engagement

## 2021-05-29 NOTE — Progress Notes (Signed)
Hearing Screening - Comments:: Has hearing aids. Not wearing them today. Vision Screening - Comments:: March 2022  

## 2021-05-29 NOTE — Assessment & Plan Note (Signed)
Discussed leg strengthening, walking stick Consider PT if doesn't improve

## 2021-05-29 NOTE — Assessment & Plan Note (Signed)
No recurrence On ASA and statin

## 2021-05-29 NOTE — Assessment & Plan Note (Signed)
BP Readings from Last 3 Encounters:  05/29/21 118/72  11/01/20 (!) 172/100  10/12/20 136/84   Good control on lisinopirl

## 2021-05-29 NOTE — Assessment & Plan Note (Signed)
I have personally reviewed the Medicare Annual Wellness questionnaire and have noted 1. The patient's medical and social history 2. Their use of alcohol, tobacco or illicit drugs 3. Their current medications and supplements 4. The patient's functional ability including ADL's, fall risks, home safety risks and hearing or visual             impairment. 5. Diet and physical activities 6. Evidence for depression or mood disorders  The patients weight, height, BMI and visual acuity have been recorded in the chart I have made referrals, counseling and provided education to the patient based review of the above and I have provided the pt with a written personalized care plan for preventive services.  I have provided you with a copy of your personalized plan for preventive services. Please take the time to review along with your updated medication list.  No cancer screening Flu vaccine today Bivalent COVID vaccine soon Discussed exercise Td if any injury shingrix at pharmacy

## 2021-05-29 NOTE — Assessment & Plan Note (Signed)
Still dysthymia with move, etc Didn't do well with reduced mirtazapine

## 2021-09-06 ENCOUNTER — Other Ambulatory Visit: Payer: Self-pay | Admitting: Internal Medicine

## 2021-10-05 ENCOUNTER — Other Ambulatory Visit: Payer: Self-pay | Admitting: Internal Medicine

## 2021-10-09 ENCOUNTER — Telehealth: Payer: Self-pay | Admitting: Internal Medicine

## 2021-10-09 MED ORDER — MIRTAZAPINE 30 MG PO TABS: 30.0000 mg | ORAL_TABLET | Freq: Every day | ORAL | 11 refills | Status: DC

## 2021-10-09 NOTE — Addendum Note (Signed)
Addended by: Pilar Grammes on: 10/09/2021 10:07 AM   Modules accepted: Orders

## 2021-10-09 NOTE — Telephone Encounter (Signed)
Pt needs a refill on mirtazapine (REMERON) 30 MG tablet sent to [facility]

## 2021-10-12 NOTE — Telephone Encounter (Signed)
Pt called in stated pharmacy did not fill RX mirtazapine (REMERON) 30 MG tablet stated she need a new proscription . Pt is all out of medication . Please Advise # [phone]

## 2021-10-12 NOTE — Telephone Encounter (Signed)
Spoke to pt. She said she received it in the mail after her call.

## 2021-11-01 ENCOUNTER — Other Ambulatory Visit: Payer: Self-pay | Admitting: Internal Medicine

## 2021-12-18 IMAGING — MR MR HEAD W/O CM
11 series · 48 of 48 positions shown · non-contrast
Comparison: None.

CLINICAL DATA: Transient ischemic attack.

EXAM:
MRI HEAD WITHOUT CONTRAST
TECHNIQUE: Multiplanar, multiecho pulse sequences of the brain and surrounding
structures were obtained without intravenous contrast.

[Series 5: ax dwi_tracew · axial · 3.0mm · 0.71mm/px · z∈[-82,+81]mm · 5 of 56 slices shown]
[im 1/56]
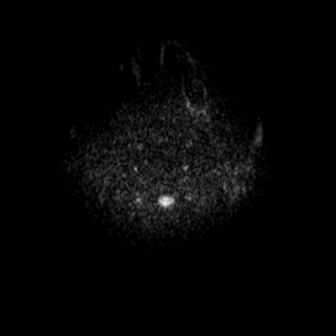
[im 14/56]
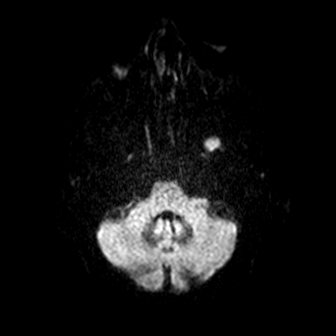
[im 28/56]
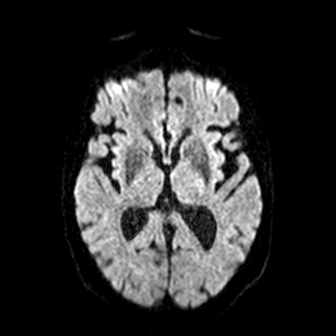
[im 42/56]
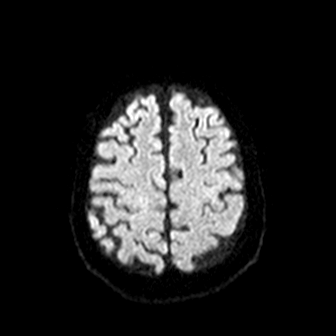
[im 56/56]
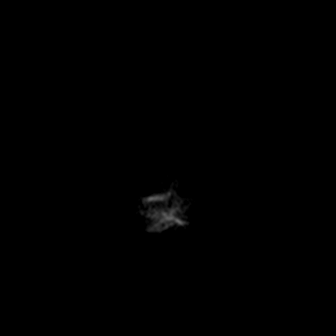

[Series 6: ax dwi_adc · axial · 3.0mm · 0.71mm/px · z∈[-82,+81]mm · 5 of 56 slices shown]
[im 1/56]
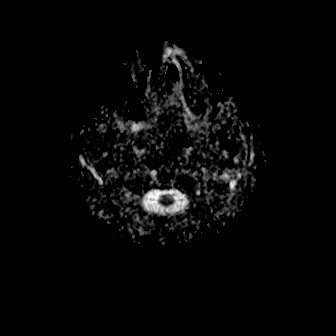
[im 14/56]
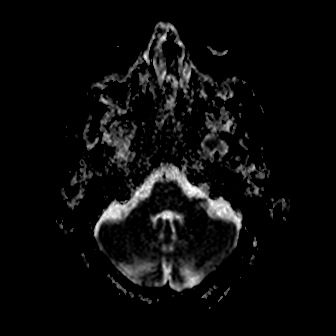
[im 28/56]
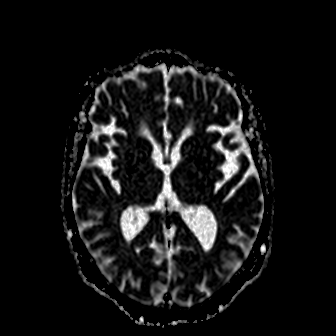
[im 42/56]
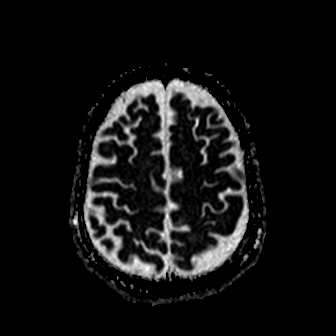
[im 56/56]
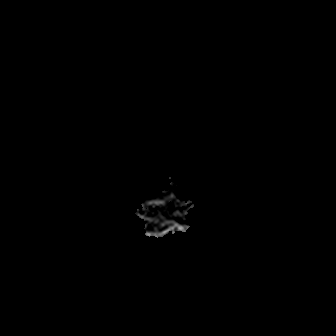

[Series 7: cor dwi_tracew · coronal · 5.0mm · 0.68mm/px · 3 of 40 slices shown]
[im 1/40]
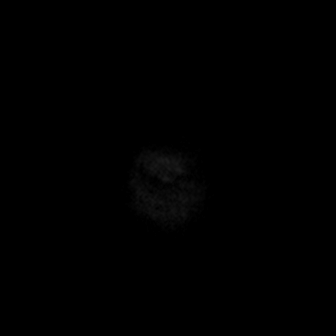
[im 20/40]
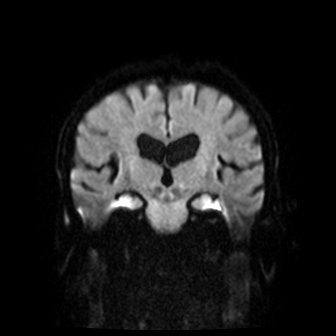
[im 40/40]
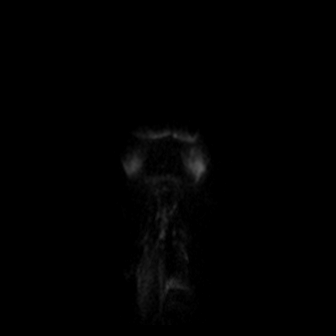

[Series 8: cor dwi_adc · coronal · 5.0mm · 0.68mm/px · 3 of 40 slices shown]
[im 1/40]
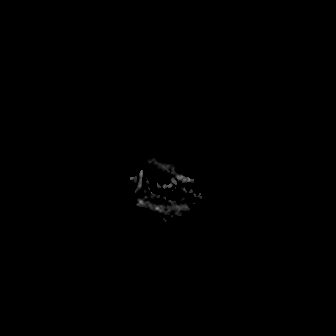
[im 20/40]
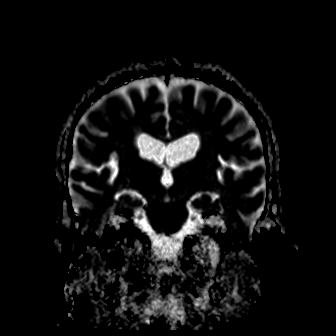
[im 40/40]
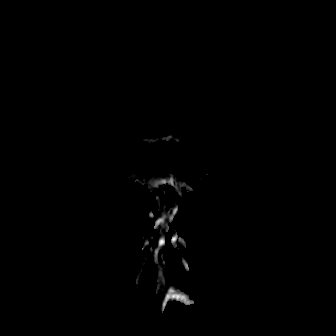

[Series 9: T1 · sagittal · 5.0mm · 0.62mm/px · 2 of 21 slices shown (1 of 2)]
[im 1/21]
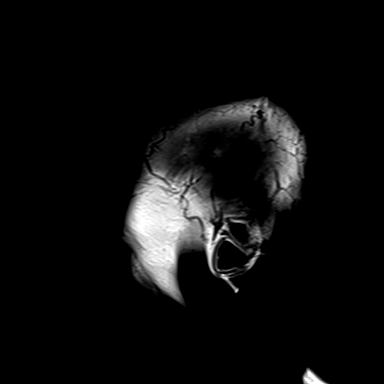
[im 21/21]
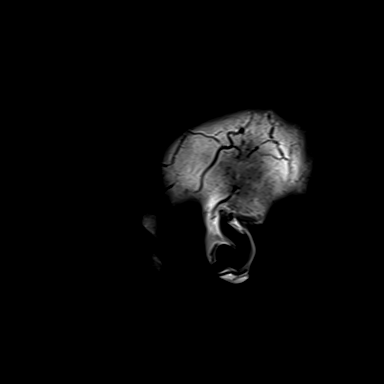

[Series 10: T2 · axial · 5.0mm · 0.53mm/px · z∈[-71,+71]mm · 2 of 25 slices shown (1 of 2)]
[im 1/25]
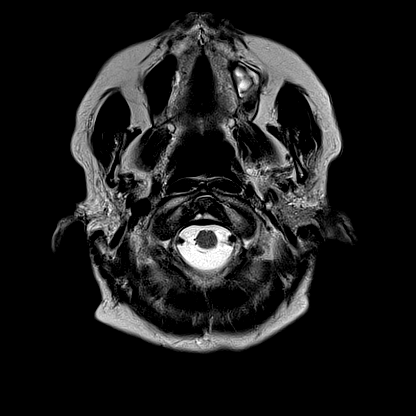
[im 25/25]
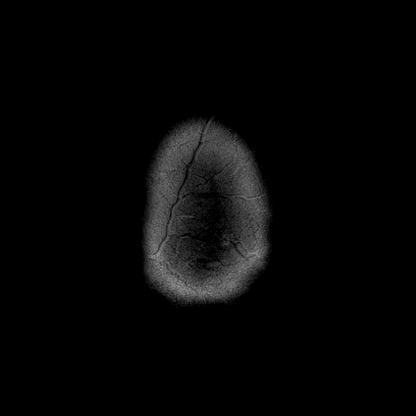

[Series 12: pha_images · axial · 3.0mm · 0.90mm/px · z∈[-75,+77]mm · 4 of 52 slices shown]
[im 1/52]
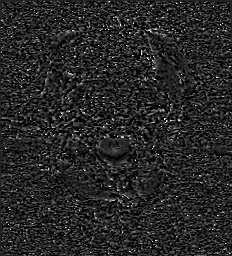
[im 18/52]
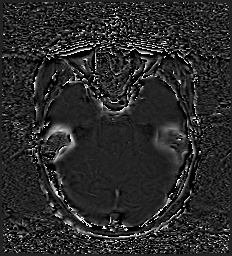
[im 35/52]
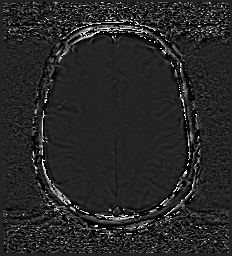
[im 52/52]
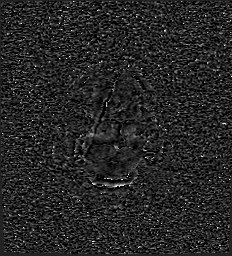

[Series 13: swi_images · axial · 3.0mm · 0.90mm/px · z∈[-75,+77]mm · 4 of 52 slices shown]
[im 1/52]
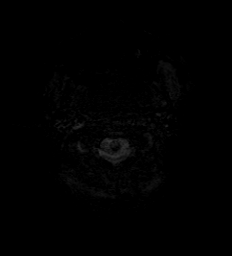
[im 18/52]
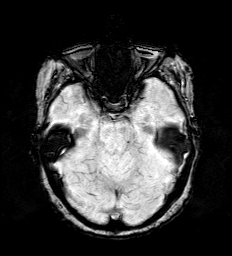
[im 35/52]
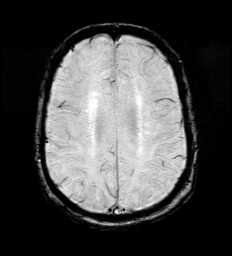
[im 52/52]
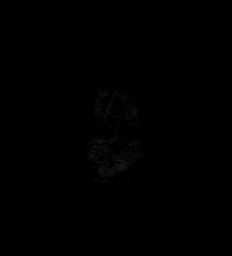

[Series 15: FLAIR · axial · 3.0mm · 0.69mm/px · z∈[-80,+80]mm · 4 of 55 slices shown]
[im 1/55]
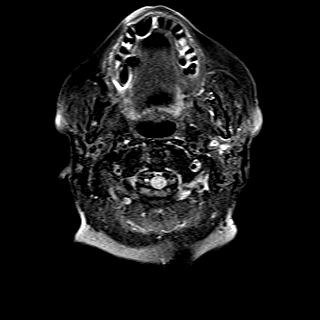
[im 19/55]
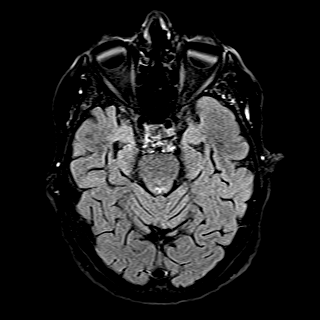
[im 37/55]
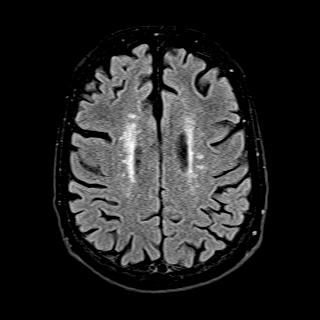
[im 55/55]
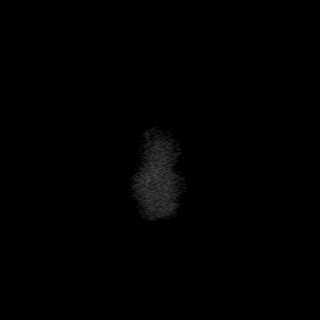

[Series 16: T1 · axial · 1.0mm · 0.98mm/px · z∈[-82,+92]mm · 14 of 176 slices shown (2 of 2)]
[im 1/176]
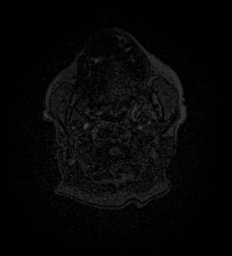
[im 14/176]
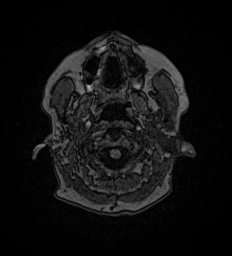
[im 27/176]
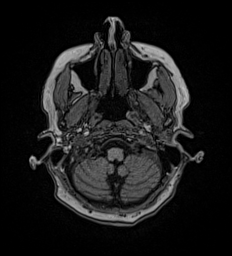
[im 41/176]
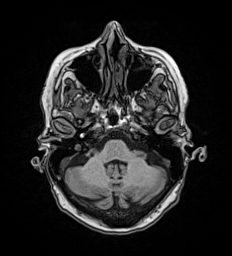
[im 54/176]
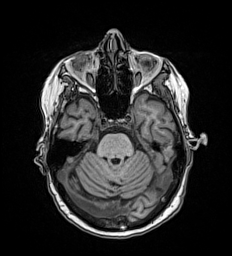
[im 68/176]
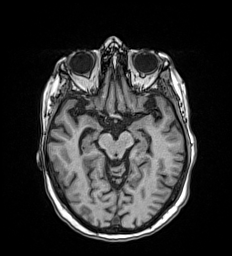
[im 81/176]
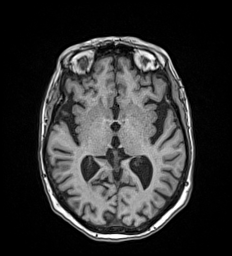
[im 95/176]
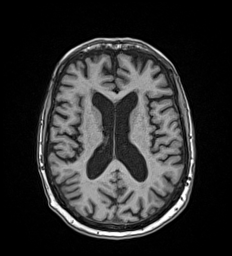
[im 108/176]
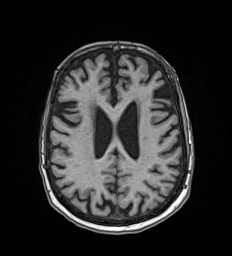
[im 122/176]
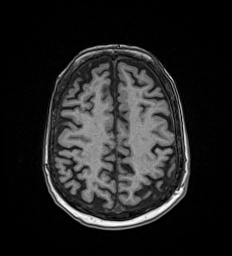
[im 135/176]
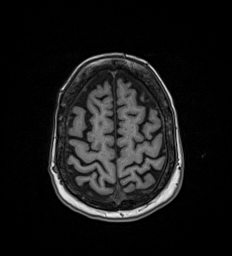
[im 149/176]
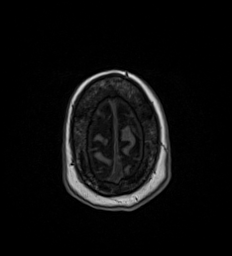
[im 162/176]
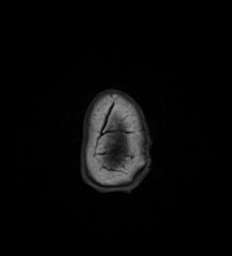
[im 176/176]
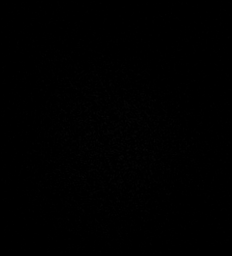

[Series 17: T2 · coronal · 5.0mm · 0.57mm/px · 2 of 29 slices shown (2 of 2)]
[im 1/29]
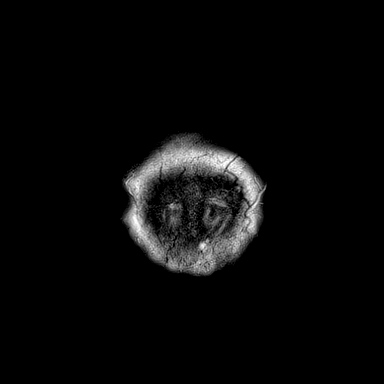
[im 29/29]
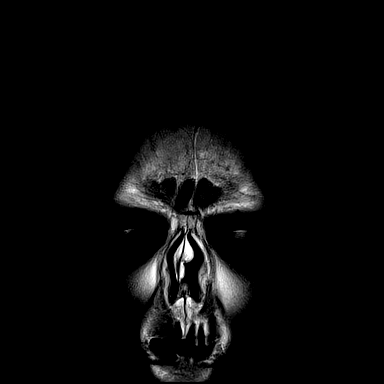

[48 of 48 positions shown; findings below may reference images not displayed]

FINDINGS: Brain: No acute infarction, hemorrhage, hydrocephalus, extra-axial
collection or mass lesion.

Scattered foci of T2 hyperintensity are seen within the white matter
of the cerebral hemispheres, nonspecific, most likely related to
mild chronic microvascular ischemic changes.

Vascular: Normal flow voids.

Skull and upper cervical spine: Normal marrow signal.

Sinuses/Orbits: Bilateral lens surgery. Mild mucosal thickening in
the left maxillary sinus.

Other: Asymmetry of the parotid glands with decreased volume on the
right.
IMPRESSION: 1. No acute intracranial abnormality.
2. Mild chronic microvascular ischemic changes.
3. Asymmetry of the parotid glands with decreased volume on the
right.

## 2022-06-04 ENCOUNTER — Encounter: Payer: Medicare Other | Admitting: Internal Medicine

## 2022-07-17 ENCOUNTER — Other Ambulatory Visit: Payer: Self-pay | Admitting: Internal Medicine

## 2022-08-29 ENCOUNTER — Other Ambulatory Visit: Payer: Self-pay | Admitting: Internal Medicine

## 2022-09-02 ENCOUNTER — Ambulatory Visit: Payer: Medicare Other | Admitting: Student

## 2022-09-02 ENCOUNTER — Encounter: Payer: Self-pay | Admitting: Student

## 2022-09-02 VITALS — BP 148/88 | HR 87 | Temp 97.9°F | Ht 67.5 in | Wt 122.0 lb

## 2022-09-02 DIAGNOSIS — Z0000 Encounter for general adult medical examination without abnormal findings: Secondary | ICD-10-CM

## 2022-09-02 DIAGNOSIS — I10 Essential (primary) hypertension: Secondary | ICD-10-CM

## 2022-09-02 DIAGNOSIS — G479 Sleep disorder, unspecified: Secondary | ICD-10-CM

## 2022-09-02 DIAGNOSIS — G3184 Mild cognitive impairment, so stated: Secondary | ICD-10-CM | POA: Diagnosis not present

## 2022-09-02 DIAGNOSIS — G459 Transient cerebral ischemic attack, unspecified: Secondary | ICD-10-CM | POA: Diagnosis not present

## 2022-09-02 DIAGNOSIS — R269 Unspecified abnormalities of gait and mobility: Secondary | ICD-10-CM

## 2022-09-02 DIAGNOSIS — Z7189 Other specified counseling: Secondary | ICD-10-CM

## 2022-09-02 MED ORDER — LISINOPRIL 20 MG PO TABS: 20.0000 mg | ORAL_TABLET | Freq: Every day | ORAL | 3 refills | Status: DC

## 2022-09-02 NOTE — Patient Instructions (Addendum)
Please start flonase two sprays daily for your nasal congestion.   Please consider taking protein supplementation with Ensure, Boost, or Thrive Ice cream.   I am writing a referral for physical therapy, please schedule a time with therapy here on campus.   MAKE YOUR BRAIN THE BEST IT CAN BE:  There is evidence that getting regular physical exercise and eating a heart-healthy diet may slow down  changes in memory and thinking. What's good for your heart is good for your brain!  Exercise: Anything that gets your heart beating faster counts! This means activities as simple as brisk  walking or doing yard work count as exercise. I recommend that you get 150 minutes of moderateintensity aerobic activity per week. Moderate-intensity means that are breathing harder and your  heart is beating faster. You should be able to talk, but not sing the words to your favorite song. You  can split up the 150 minutes however you want. For example, you could take a brisk, thirty minute  walk five days a week. I also recommend that on two or more days a week that you do musclestrengthening activities that work all major muscle groups.  Some insurance companies cover a program called Geophysicist/field seismologist. [employer] includes a free  memberships to gyms, exercise classes and fitness groups. You can check your eligibility at  www.silversneakers.com. The [facility] located at [address] [place], [place] [address]  also offers fitness memberships. You can find out more by calling [phone]. Healthy Heart and Brain Diet: Eating a heart-healthy diet is also good for your brain! One example of a  heart-healthy diet is the Mediterranean diet. The Mediterranean diet is rich in fish and poultry instead  of red meat, nuts (mostly non-peanuts), vegetables, fruits, olive oil or canola oil (instead of butter),  whole grain rice, bread, cereal and pasta, and minimizes salt (relying on other spices to flavor foods),   and small amounts of wine. There is evidence that the MIND diet, which is a combination of the  Mediterranean diet and the DASH diet, is good for your brain. You can learn more by reading the  book: The MIND Diet by Alonna Minium or visiting [employer]. General Health: Making sure that your blood pressure, blood sugar, and cholesterol are wellcontrolled can decrease your risk of heart attack, stroke, and protect your brain. If you have specific  questions or concerns, reach out on MyChart or talk to your primary care doctor. Also, any illness or  diseases that affect your body can make memory or thinking worse. This means that something as  simple as a cold can cause your memory and thinking to temporarily get worse. I recommend that you  stay up-to-date on your vaccinations and see your primary care doctor for regular follow up. Sleep: Your brain needs high quality, uninterrupted sleep to rest and recover after every day. If you  have questions or concerns about your sleep, reach out to me over MyChart or talk to your primary  care doctor.  Alcohol: Alcohol can make your memory and thinking problems worse. I recommend that you limit or  stop alcohol use. Social Activity and Brain Training: Socializing is great for your brain! Other activities that may help  your brain include reading, cross-word puzzles, learning new things or developing new hobbies. I  recommend that you reach out to your local senior center to learn about services in your area.

## 2022-09-02 NOTE — Progress Notes (Addendum)
Location:   [facility]   Place [place] Service[place]   [place] [facility] Provider:  Reather Converse, Berneda Rose, MD  Patient Care Team: Karie Schwalbe, MD as PCP - General (Internal Medicine)  Extended Emergency Contact Information Primary Emergency Contact: Toney Sang Mobile Phone: [phone] Relation: Daughter  Code Status:  DNR Goals of care: Advanced Directive information    09/02/2022    1:20 PM  Advanced Directives  Does Patient Have a Medical Advance Directive? Yes  Type of Estate agent of [place];Living will  Does patient want to make changes to medical advance directive? No - Patient declined  Copy of Healthcare Power of Attorney in Chart? No - copy requested     Chief Complaint  Patient presents with   Establish Care    NP to Establish Care. Wants to discuss DNR    HPI:  Pt is a 81 y.o. female seen today for medical management of chronic diseases.    She is here today with her daughter Sela Hua.   Her main goal is to stay healthy.   She feels like she may have allergies. She has some congestion, a change in her voice. Her voice has changed for some time. Her daughter says that her voice hasn't changed much in her opinion.   She has three children. 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 She is also Catholic. She worked some before she had children with [employer] in [place]. She got married and moved to [place]. She is [employer]. She has lived here at [facility] for three years. She has had some changes in balance. At the eye doctor she kind of stumbled. No falls, but concern for balance. She has a cane that she uses sometimes. She stopped driving about 2 years ago. She cooks and cleans for herself. Sh has had some issues with memory - losing short term and then not focusing as well as previously. She wears hearing aids. 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   Appetite has been fine. She has fluctuations  She eats meat once a week. She doesn't exercise. She used to exercise regularly. She is going to do personal training with Angelica Chessman  the trainer on campus.   Hypertension- BP slightly elevated today. She doesn't take it as she should.   Her sister died when she was 43. Her sister had heart trouble. She felt shut down until her daughter was born. She connected the two.   Her daughter says she has withdrawn a bit. She says her mood has been fine. She doesn't feel sad she doesn't want to be around "fiddle faddle." Sometimes the neighbors are a bit nosy so she'd rather keep to herself. States her mood has   Past Medical History:  Diagnosis Date   Anxiety    History of breast cancer    Hypertension    Sleep disturbance    Ulcerative colitis (HCC) 2003   Past Surgical History:  Procedure Laterality Date   BREAST LUMPECTOMY Right 1990's   BREAST LUMPECTOMY Right 1996   CHOLECYSTECTOMY     INGUINAL HERNIA REPAIR  2003   strangulated   LEEP  04/1997   with cervical dilation   TONSILLECTOMY      No Known Allergies  Outpatient Encounter Medications as of 09/02/2022  Medication Sig   aspirin EC 81 MG  tablet Take 81 mg by mouth daily. Swallow whole.   lisinopril (ZESTRIL) 10 MG tablet Take 10 mg by mouth daily.   mirtazapine (REMERON) 30 MG tablet TAKE 1 TABLET (30 MG TOTAL) BY MOUTH AT BEDTIME. PT ALTERNATES WITH 1/2 OR WHOLE TABLET   Multiple Vitamins-Minerals (ALIVE WOMENS ENERGY PO) Take 1 tablet by mouth daily.   Multiple Vitamins-Minerals (PRESERVISION AREDS 2) CAPS Take 1 capsule by mouth daily.   rosuvastatin (CRESTOR) 20 MG tablet TAKE 1 TABLET (20 MG TOTAL) BY MOUTH WEEKLY   [DISCONTINUED] lisinopril (ZESTRIL) 10 MG tablet Take 1 tablet (10 mg total) by mouth 2 (two) times daily. NEEDS OFFICE VISIT   [DISCONTINUED] Multiple Vitamin (MULTIVITAMIN) capsule Take 1 capsule by mouth daily.   No  facility-administered encounter medications on file as of 09/02/2022.    Review of Systems  All other systems reviewed and are negative.   Immunization History  Administered Date(s) Administered   Fluad Quad(high Dose 65+) 05/29/2021   Influenza-Unspecified 05/25/2019, 07/09/2022   Moderna SARS-COV2 Booster Vaccination 07/19/2020   Moderna Sars-Covid-2 Vaccination 10/03/2019, 11/02/2019, 02/08/2021, 02/19/2022, 08/02/2022   PFIZER(Purple Top)SARS-COV-2 Vaccination 06/14/2021   Pneumococcal Conjugate-13 08/10/2015   Pneumococcal Polysaccharide-23 04/20/2008   Td 09/23/2005   Zoster Recombinat (Shingrix) 06/12/2022   Zoster, Live 04/20/2008   Pertinent  Health Maintenance Due  Topic Date Due   INFLUENZA VACCINE  Completed   DEXA SCAN  Completed       No data to display         Functional Status Survey:    Vitals:   09/02/22 1305  BP: (!) 148/88  Pulse: 87  Temp: 97.9 F (36.6 C)  SpO2: 97%  Weight: 122 lb (55.3 kg)  Height: 5' 7.5" (1.715 m)   Body mass index is 18.83 kg/m. Physical Exam Vitals and nursing note reviewed.  Constitutional:      Appearance: Normal appearance.  Cardiovascular:     Rate and Rhythm: Normal rate and regular rhythm.     Pulses: Normal pulses.     Heart sounds: Normal heart sounds.  Pulmonary:     Effort: Pulmonary effort is normal.     Breath sounds: Normal breath sounds.  Abdominal:     General: Abdomen is flat. Bowel sounds are normal.     Palpations: Abdomen is soft.  Musculoskeletal:     Comments: 4/5 strength bilateral upper extremities biceps, triceps, grip. 5/5 flexion and extension of the knee.   Skin:    General: Skin is warm and dry.  Neurological:     Mental Status: She is alert and oriented to person, place, and time.     Comments: Slouched, slow gait, with low clearance.      Labs reviewed: No results for input(s): "NA", "K", "CL", "CO2", "GLUCOSE", "BUN", "CREATININE", "CALCIUM", "MG", "PHOS" in the last 8760  hours. No results for input(s): "AST", "ALT", "ALKPHOS", "BILITOT", "PROT", "ALBUMIN" in the last 8760 hours. No results for input(s): "WBC", "NEUTROABS", "HGB", "HCT", "MCV", "PLT" in the last 8760 hours. No results found for: "TSH" No results found for: "HGBA1C" Lab Results  Component Value Date   CHOL 209 (H) 05/29/2021   HDL 64.00 05/29/2021   LDLCALC 124 (H) 05/29/2021   TRIG 102.0 05/29/2021   CHOLHDL 3 05/29/2021    Significant Diagnostic Results in last 30 days:  No results found.  Assessment/Plan 1. Essential hypertension, benign BP slightly elevated today in the setting of inconsistent medication adherence. Discussed adjusting dosing to once daily instead of BID.  Continue lisinopril 20 mg daily.   2. TIA (transient ischemic attack) Discussed continue aspirin and that it is is shared decision making since she has no history of stroke or hear attack. She plans to take the medication every other day.   3. Sleep disturbance Patient Continues to take mirtazapine 30 mg nightly.   4. Gait disturbance Patient has had notable near falling incidents for the last year. She is planning to start working with a Systems analyst, but also recommended a   5. Goals of care, counseling/discussion Discussed concern for lack of full recovery if she were to have a major cardiac event. Discussed pros and cons. Completed DNR form per patient's wishes. Completed a MOST form. Plan to readdress annually. Patient has not had colonoscopy for 17 years and last mammogram 5 years ago. No Falls, order PT for evaluation. No longer driving, agree with this change. Due for tetanus and shingrix, will recommend at follow up appointment.   6. Mild Cognitive impairment SLUMS today 24/30 consistent with MCI. Discussed diet and exercise. Discussed importance of adequate sleep. Discussed supporting her memory changes by having a routine for wearing hearing aids. Consider pill box for medication adherence. Will  follow up in 3 mo.    Family/ staff Communication: Daughter, nurse  Labs/tests ordered:  Order for follow up visit: CBC, CMP, Urinalysis, T4

## 2022-12-04 ENCOUNTER — Ambulatory Visit: Payer: Medicare Other | Admitting: Student

## 2022-12-04 ENCOUNTER — Encounter: Payer: Self-pay | Admitting: Student

## 2022-12-04 VITALS — BP 138/86 | HR 84 | Temp 97.3°F | Ht 67.5 in | Wt 123.0 lb

## 2022-12-04 DIAGNOSIS — G479 Sleep disorder, unspecified: Secondary | ICD-10-CM | POA: Diagnosis not present

## 2022-12-04 DIAGNOSIS — G3184 Mild cognitive impairment, so stated: Secondary | ICD-10-CM

## 2022-12-04 DIAGNOSIS — I10 Essential (primary) hypertension: Secondary | ICD-10-CM | POA: Diagnosis not present

## 2022-12-04 DIAGNOSIS — F39 Unspecified mood [affective] disorder: Secondary | ICD-10-CM

## 2022-12-04 DIAGNOSIS — G459 Transient cerebral ischemic attack, unspecified: Secondary | ICD-10-CM

## 2022-12-04 DIAGNOSIS — R269 Unspecified abnormalities of gait and mobility: Secondary | ICD-10-CM

## 2022-12-04 NOTE — Progress Notes (Signed)
Location:  [facility]  Provider: Dr. Dewayne Shorter  Code Status: DNR Goals of Care:     12/04/2022   12:59 PM  Advanced Directives  Does Patient Have a Medical Advance Directive? Yes  Type of Advance Directive Out of facility DNR (pink MOST or yellow form)  Does patient want to make changes to medical advance directive? No - Patient declined    Chief Complaint  Patient presents with   Medical Management of Chronic Issues    Medical Management of Chronic Issues. 3 Month Follow up   Quality Metric Gaps    To discuss need for Tdap,Covid, Zoster     HPI: Patient is a 82 y.o. female seen today for medical management of chronic diseases.    She is here today with her daughter, Darrick Meigs.   She has been exercising more 2x per week with physical therapy. Walking more. Jumping on trampoline more. She has not had any falls, but has noticed improvement in balance.   Hypertension - she takes lisinopril in the mornings.   Mirtazapine - helps with her sleep, but she doesn't take every night. She takes it perhaps 2x per week. Off and on when her mood declines, she stops taking the medication.   A few years ago she had an episode where she was difficult to understand. She was fine the next day. She had some tests to check for carotid artery and brain scan - she had some vascular damage. She has not been taking her aspirin. Off and on she has  Past Medical History:  Diagnosis Date   Anxiety    History of breast cancer    Hypertension    Sleep disturbance    Ulcerative colitis ([place]) 2003    Past Surgical History:  Procedure Laterality Date   BREAST LUMPECTOMY Right 1990's   BREAST LUMPECTOMY Right 1996   CHOLECYSTECTOMY     INGUINAL HERNIA REPAIR  2003   strangulated   LEEP  04/1997   with cervical dilation   TONSILLECTOMY      No Known Allergies  Outpatient Encounter Medications as of 12/04/2022  Medication Sig   lisinopril (ZESTRIL) 20 MG tablet Take 1 tablet (20 mg  total) by mouth daily.   Multiple Vitamins-Minerals (ALIVE WOMENS ENERGY PO) Take 1 tablet by mouth daily.   Multiple Vitamins-Minerals (PRESERVISION AREDS 2) CAPS Take 1 capsule by mouth daily.   rosuvastatin (CRESTOR) 20 MG tablet TAKE 1 TABLET (20 MG TOTAL) BY MOUTH WEEKLY   aspirin EC 81 MG tablet Take 81 mg by mouth daily. Swallow whole. (Patient not taking: Reported on 12/04/2022)   mirtazapine (REMERON) 30 MG tablet TAKE 1 TABLET (30 MG TOTAL) BY MOUTH AT BEDTIME. PT ALTERNATES WITH 1/2 OR WHOLE TABLET (Patient not taking: Reported on 12/04/2022)   No facility-administered encounter medications on file as of 12/04/2022.    Review of Systems:  Review of Systems  Health Maintenance  Topic Date Due   DTaP/Tdap/Td (2 - Tdap) 09/24/2015   Medicare Annual Wellness (AWV)  05/29/2022   Zoster Vaccines- Shingrix (2 of 2) 08/07/2022   COVID-19 Vaccine (8 - 2023-24 season) 09/27/2022   Pneumonia Vaccine 67+ Years old  Completed   INFLUENZA VACCINE  Completed   DEXA SCAN  Completed   HPV VACCINES  Aged Out    Physical Exam: Vitals:   12/04/22 1255  BP: 138/86  Pulse: 84  Temp: (!) 97.3 F (36.3 C)  SpO2: 94%  Weight: 123 lb (55.8 kg)  Height: 5' 7.5" (1.715 m)   Body mass index is 18.98 kg/m. Physical Exam Constitutional:      Appearance: Normal appearance.  Cardiovascular:     Rate and Rhythm: Normal rate and regular rhythm.     Pulses: Normal pulses.  Pulmonary:     Effort: Pulmonary effort is normal.     Breath sounds: Normal breath sounds.  Skin:    General: Skin is warm and dry.  Neurological:     Mental Status: She is alert and oriented to person, place, and time. Mental status is at baseline.  Psychiatric:        Mood and Affect: Mood normal.     Labs reviewed: Basic Metabolic Panel: No results for input(s): "NA", "K", "CL", "CO2", "GLUCOSE", "BUN", "CREATININE", "CALCIUM", "MG", "PHOS", "TSH" in the last 8760 hours. Liver Function Tests: No results for  input(s): "AST", "ALT", "ALKPHOS", "BILITOT", "PROT", "ALBUMIN" in the last 8760 hours. No results for input(s): "LIPASE", "AMYLASE" in the last 8760 hours. No results for input(s): "AMMONIA" in the last 8760 hours. CBC: No results for input(s): "WBC", "NEUTROABS", "HGB", "HCT", "MCV", "PLT" in the last 8760 hours. Lipid Panel: No results for input(s): "CHOL", "HDL", "LDLCALC", "TRIG", "CHOLHDL", "LDLDIRECT" in the last 8760 hours. No results found for: "HGBA1C"  Procedures since last visit: No results found.  Assessment/Plan 1. Mood disorder (HCC) Mood is stable at this time. Encouraged medication compliance. Continue mirtazapine 30 mg nightly   2. Essential hypertension, benign BP slightly above goal today 130/80. Given patient's age, some concern for tighter control.   3. TIA (transient ischemic attack) No recent episodes, continue ASA 81 mg daily.   4. Sleep disturbance No issues at this time, continue mirtazapine.   5. MCI (mild cognitive impairment) Continues to have some progress of memory changes. BMI is borderline for malnourished. Encouraged high protein diet. Discussed the importance of consistency and eating her meals first. Given patient's memory changes, discussed importance of continuing things that give her joy, even if it's eating candy. Continue Dental visits. [id number] to evaluate for changes in glucose levels. Note malnourished is BMI less than 18.5, and she currently sits at 18.98.    6. Gait disturbance Balance improving with physical exercise and PT/ CTM.     Labs/tests ordered:  * No order type specified * Next appt:  Visit date not found

## 2022-12-04 NOTE — Patient Instructions (Addendum)
Freeze bananas and then blend 1/2 a banana with the protein shake for an ice cream alternative.   Consider purchasing protein supplementation with nutritious shakes - Premier Protein, Fairlife Protien shakes, Boost, Ensure.   High-protein foods contain 4 grams (g) or more of protein per serving. They include: Grains Quinoa (cooked) -- 1 cup (185 g) has 8 g of protein. Whole wheat pasta (cooked) -- 1 cup (140 g) has 6 g of protein. Meat Beef, ground sirloin (cooked) -- 3 oz (85 g) has 24 g of protein. Chicken breast, boneless and skinless (cooked) -- 3 oz (85 g) has 25 g of protein. Egg -- 1 egg has 6 g of protein. Fish, filet (cooked) -- 1 oz (28 g) has 6-7 g of protein. Lamb (cooked) -- 3 oz (85 g) has 24 g of protein. Pork tenderloin (cooked) -- 3 oz (85 g) has 23 g of protein. Tuna (canned in water) -- 3 oz (85 g) has 20 g of protein. Dairy Cottage cheese --  cup (114 g) has 13.4 g of protein. Milk -- 1 cup (237 mL) has 8 g of protein. Cheese (hard) -- 1 oz (28 g) has 7 g of protein. Yogurt, regular -- 6 oz (170 g) has 8 g of protein. Greek yogurt -- 6 oz (200 g) has 18 g protein. Plant protein Garbanzo beans (canned or cooked) --  cup (130 g) has 6-7 g of protein. Kidney beans (canned or cooked) --  cup (130 g) has 6-7 g of protein. Nuts (peanuts, pistachios, almonds) -- 1 oz (28 g) has 6 g of protein. Peanut butter -- 1 oz (32 g) has 7-8 g of protein. Pumpkin seeds -- 1 oz (28 g) has 8.5 g of protein. Soybeans (roasted) -- 1 oz (28 g) has 8 g of protein. Soybeans (cooked) --  cup (90 g) has 11 g of protein. Soy milk -- 1 cup (250 mL) has 5-10 g of protein. Soy or vegetable patty -- 1 patty has 11 g of protein. Sunflower seeds -- 1 oz (28 g) has 5.5 g of protein. Buckwheat -- 1 oz (33 g) has 4.3 g of protein. Tofu (firm) --  cup (124 g) has 20 g of protein. Tempeh --  cup (83 g) has 16 g of protein.

## 2022-12-05 ENCOUNTER — Other Ambulatory Visit: Payer: Self-pay | Admitting: Internal Medicine

## 2022-12-05 LAB — CBC AND DIFFERENTIAL
HCT: 35 — AB (ref 36–46)
Hemoglobin: 11.5 — AB (ref 12.0–16.0)
Neutrophils Absolute: 4093
Platelets: 220 10*3/uL (ref 150–400)
WBC: 5.7

## 2022-12-05 LAB — BASIC METABOLIC PANEL WITH GFR
BUN: 17 (ref 4–21)
CO2: 30 — AB (ref 13–22)
Chloride: 100 (ref 99–108)
Creatinine: 0.8 (ref 0.5–1.1)
Glucose: 78
Potassium: 4.1 meq/L (ref 3.5–5.1)
Sodium: 137 (ref 137–147)

## 2022-12-05 LAB — HEPATIC FUNCTION PANEL
ALT: 3 U/L — AB (ref 7–35)
AST: 14 (ref 13–35)
Alkaline Phosphatase: 62 (ref 25–125)
Bilirubin, Total: 0.5

## 2022-12-05 LAB — COMPREHENSIVE METABOLIC PANEL WITH GFR
Albumin: 4 (ref 3.5–5.0)
Calcium: 9.2 (ref 8.7–10.7)
Globulin: 2.2
eGFR: 74

## 2022-12-05 LAB — CBC: RBC: 3.66 — AB (ref 3.87–5.11)

## 2022-12-05 LAB — VITAMIN B12: Vitamin B-12: 348

## 2022-12-05 LAB — VITAMIN D 25 HYDROXY (VIT D DEFICIENCY, FRACTURES): Vit D, 25-Hydroxy: 49

## 2022-12-10 ENCOUNTER — Encounter: Payer: Medicare Other | Admitting: Nurse Practitioner

## 2022-12-10 NOTE — Progress Notes (Signed)
  This service is provided via telemedicine  No vital signs collected/recorded due to the encounter was a telemedicine visit.   Location of patient (ex: home, work):  home  Patient consents to a telephone visit:  Yes  Location of the provider (ex: office, home):  [place].   Name of any referring provider:  na  Names of all persons participating in the telemedicine service and their role in the encounter:  Benay Pike, Anita May, CMA, Sherrie Mustache, NP   [facility] to return call at 9:52am

## 2022-12-13 ENCOUNTER — Telehealth: Payer: Self-pay | Admitting: Student

## 2022-12-13 NOTE — Telephone Encounter (Signed)
-----   Message from Carroll Kinds, [place] sent at 12/10/2022 11:37 AM EDT ----- abstracted

## 2022-12-13 NOTE — Telephone Encounter (Signed)
Please call patient to inform her that her recent labs were within therapeutic range. Her hemoglobin (test for anemia) was slightly low at 11.5 (11.7 is normal) Encourage to eat leafy greens and iron-rich foods and there is no indication to change medications at this time.

## 2022-12-13 NOTE — Telephone Encounter (Signed)
LMOM to return call.

## 2022-12-16 NOTE — Telephone Encounter (Signed)
Patient notified and agreed.  

## 2022-12-16 NOTE — Telephone Encounter (Signed)
LMOM to return call.

## 2022-12-23 ENCOUNTER — Other Ambulatory Visit: Payer: Self-pay | Admitting: Internal Medicine

## 2023-03-04 ENCOUNTER — Other Ambulatory Visit: Payer: Self-pay | Admitting: Internal Medicine

## 2023-03-07 ENCOUNTER — Ambulatory Visit: Payer: Medicare Other | Admitting: Student

## 2023-03-07 ENCOUNTER — Encounter: Payer: Self-pay | Admitting: Student

## 2023-03-07 VITALS — BP 122/74 | HR 80 | Temp 97.6°F | Ht 67.5 in | Wt 122.0 lb

## 2023-03-07 DIAGNOSIS — G459 Transient cerebral ischemic attack, unspecified: Secondary | ICD-10-CM | POA: Diagnosis not present

## 2023-03-07 DIAGNOSIS — Z0000 Encounter for general adult medical examination without abnormal findings: Secondary | ICD-10-CM

## 2023-03-07 DIAGNOSIS — F39 Unspecified mood [affective] disorder: Secondary | ICD-10-CM | POA: Diagnosis not present

## 2023-03-07 DIAGNOSIS — G3184 Mild cognitive impairment, so stated: Secondary | ICD-10-CM

## 2023-03-07 DIAGNOSIS — I10 Essential (primary) hypertension: Secondary | ICD-10-CM | POA: Diagnosis not present

## 2023-03-07 MED ORDER — ROSUVASTATIN CALCIUM 20 MG PO TABS: 20.0000 mg | ORAL_TABLET | Freq: Every day | ORAL | 3 refills | Status: DC

## 2023-03-07 MED ORDER — PRESERVISION AREDS 2 PO CAPS
1.0000 | ORAL_CAPSULE | Freq: Every day | ORAL | 3 refills | Status: AC
Start: 2023-03-07 — End: ?

## 2023-03-07 MED ORDER — MIRTAZAPINE 30 MG PO TABS: 30.0000 mg | ORAL_TABLET | Freq: Every day | ORAL | 3 refills | Status: DC

## 2023-03-07 MED ORDER — LISINOPRIL 20 MG PO TABS: 20.0000 mg | ORAL_TABLET | Freq: Every day | ORAL | 3 refills | Status: DC

## 2023-03-07 MED ORDER — ALIVE WOMENS ENERGY PO TABS
1.0000 | ORAL_TABLET | Freq: Every day | ORAL | 3 refills | Status: AC
Start: 2023-03-07 — End: ?

## 2023-03-07 NOTE — Patient Instructions (Addendum)
Fairlife - milk with 30 grams of protein is a good protein option  Freeze Ensure to eat like ice cream  I am going to send an email to Stony [place] our nutritionist to see how we can get you additional   Please try to have 30 mg of protein per meal to help with your overall health.

## 2023-03-07 NOTE — Progress Notes (Signed)
[place]   [facility]  Provider: Sydnee Cabal  Code Status: DNR Goals of Care:     03/07/2023    3:44 PM  Advanced Directives  Does Patient Have a Medical Advance Directive? Yes  Type of Estate agent of [place];Living will;Out of facility DNR (pink MOST or yellow form)  Does patient want to make changes to medical advance directive? No - Patient declined  Copy of Healthcare Power of Attorney in Chart? No - copy requested     Chief Complaint  Patient presents with   Medical Management of Chronic Issues    3 month follow-up. Discuss need for td/tdap, shingrix, AWV, and covid boosters or post pone. Discuss antidepressant and feeling tired.     HPI: Patient is a 82 y.o. female seen today for medical management of chronic diseases.    She has felt low. She misses some of her medications during the week. Sees physical therapy three times weekly. Doesn't see athletic trainor. Has some concern that she is feeling low/down/depressed. She sleeps well. Her daughter reminds her that she takes medication for her mood, however, patient misses unknown amount of doses. Discussed a blister pill pack from the pharmacy, which she states she would be open to trying-- she has had a pill pack that she fills herself, but sometimes takes medications from the bottles etc. She will contact her daughter when she receives it so nursing on campus can aid with how to take the medication. Patient's BMI is 18.83 which is stable from previously, but still less than 21 which would be the goal.   She has been eating well. 3 meals a day typically. Often eats a lot of candy.  Cereal for breakfast Vegetables for lunch Meat and 2 vegetables for dinner  Her daughter, Elita Quick is present aiding in history.  Past Medical History:  Diagnosis Date   Anxiety    History of breast cancer    Hypertension    Sleep disturbance    Ulcerative colitis (HCC) 2003    Past  Surgical History:  Procedure Laterality Date   BREAST LUMPECTOMY Right 1990's   BREAST LUMPECTOMY Right 1996   CHOLECYSTECTOMY     INGUINAL HERNIA REPAIR  2003   strangulated   LEEP  04/1997   with cervical dilation   TONSILLECTOMY      No Known Allergies  Outpatient Encounter Medications as of 03/07/2023  Medication Sig   aspirin EC 81 MG tablet Take 81 mg by mouth daily. Swallow whole.   [DISCONTINUED] lisinopril (ZESTRIL) 20 MG tablet Take 1 tablet (20 mg total) by mouth daily.   [DISCONTINUED] mirtazapine (REMERON) 30 MG tablet TAKE 1 TABLET (30 MG TOTAL) BY MOUTH AT BEDTIME. PT ALTERNATES WITH 1/2 OR WHOLE TABLET   [DISCONTINUED] Multiple Vitamins-Minerals (ALIVE WOMENS ENERGY PO) Take 1 tablet by mouth daily.   [DISCONTINUED] Multiple Vitamins-Minerals (PRESERVISION AREDS 2) CAPS Take 1 capsule by mouth daily.   [DISCONTINUED] rosuvastatin (CRESTOR) 20 MG tablet TAKE 1 TABLET (20 MG TOTAL) BY MOUTH WEEKLY   lisinopril (ZESTRIL) 20 MG tablet Take 1 tablet (20 mg total) by mouth daily.   mirtazapine (REMERON) 30 MG tablet Take 1 tablet (30 mg total) by mouth at bedtime. Pt alternates with 1/2 or whole tablet   Multiple Vitamins-Minerals (ALIVE WOMENS ENERGY) TABS Take 1 tablet by mouth daily.   Multiple Vitamins-Minerals (PRESERVISION AREDS 2) CAPS Take 1 capsule by mouth daily.  rosuvastatin (CRESTOR) 20 MG tablet Take 1 tablet (20 mg total) by mouth daily.   No facility-administered encounter medications on file as of 03/07/2023.    Review of Systems:  Review of Systems  Health Maintenance  Topic Date Due   DTaP/Tdap/Td (2 - Tdap) 09/24/2015   Medicare Annual Wellness (AWV)  05/29/2022   Zoster Vaccines- Shingrix (2 of 2) 08/07/2022   COVID-19 Vaccine (8 - 2023-24 season) 02/25/2023   INFLUENZA VACCINE  04/24/2023   Pneumonia Vaccine 73+ Years old  Completed   DEXA SCAN  Completed   HPV VACCINES  Aged Out    Physical Exam: Vitals:   03/07/23 1049  BP: 122/74   Pulse: 80  Temp: 97.6 F (36.4 C)  TempSrc: Temporal  SpO2: 96%  Weight: 122 lb (55.3 kg)  Height: 5' 7.5" (1.715 m)   Body mass index is 18.83 kg/m. Physical Exam Constitutional:      Comments: thin  Cardiovascular:     Rate and Rhythm: Normal rate and regular rhythm.     Pulses: Normal pulses.     Heart sounds: Normal heart sounds.  Pulmonary:     Effort: Pulmonary effort is normal.  Skin:    General: Skin is warm.  Neurological:     Mental Status: She is alert and oriented to person, place, and time.     Comments: Has some confusion with how and why she should do certain things - high protein diet, maintaining a healthy weight.      Labs reviewed: Basic Metabolic Panel: Recent Labs    12/05/22 0000  NA 137  K 4.1  CL 100  CO2 30*  BUN 17  CREATININE 0.8  CALCIUM 9.2   Liver Function Tests: Recent Labs    12/05/22 0000  AST 14  ALT 3*  ALKPHOS 62  ALBUMIN 4.0   No results for input(s): "LIPASE", "AMYLASE" in the last 8760 hours. No results for input(s): "AMMONIA" in the last 8760 hours. CBC: Recent Labs    12/05/22 0000  WBC 5.7  NEUTROABS 4,093.00  HGB 11.5*  HCT 35*  PLT 220   Lipid Panel: No results for input(s): "CHOL", "HDL", "LDLCALC", "TRIG", "CHOLHDL", "LDLDIRECT" in the last 8760 hours. No results found for: "HGBA1C"  Procedures since last visit: No results found.  Assessment/Plan TIA (transient ischemic attack) - Plan: rosuvastatin (CRESTOR) 20 MG tablet  Essential hypertension, benign - Plan: lisinopril (ZESTRIL) 20 MG tablet  Mood disorder (HCC) - Plan: mirtazapine (REMERON) 30 MG tablet  Preventative health care - Plan: Multiple Vitamins-Minerals (PRESERVISION AREDS 2) CAPS, Multiple Vitamins-Minerals (ALIVE WOMENS ENERGY) TABS  MCI (mild cognitive impairment) Patient is showing some signs of decline in cognition. Having more challenges with medications, recommend trial of pill pack. She describes low mood in the presence  of poor adherence to medication regimen. Hard of hearing, however, does not wear hearing aids which could contribute to some of her challenges and isolates herself. Encouraged socialization and the importance of connection. Encouraged healthy balanced diet. Protein supplementation recommended. Nutrition referral on campus for aid in supplementation that is enjoyable and sustainable as she has not consistently continued adequate protein supplementation. Hx of TIA continue crestor and ASA. Continue Remeron for sleep and mood-- consider adjustments in the future if no improvement. Follow up in 3 months.   Labs/tests ordered:  * No order type specified * Next appt:  Visit date not found

## 2023-03-08 DIAGNOSIS — L820 Inflamed seborrheic keratosis: Secondary | ICD-10-CM | POA: Insufficient documentation

## 2023-03-08 DIAGNOSIS — D0510 Intraductal carcinoma in situ of unspecified breast: Secondary | ICD-10-CM | POA: Insufficient documentation

## 2023-03-08 DIAGNOSIS — K5790 Diverticulosis of intestine, part unspecified, without perforation or abscess without bleeding: Secondary | ICD-10-CM | POA: Insufficient documentation

## 2023-03-08 DIAGNOSIS — G479 Sleep disorder, unspecified: Secondary | ICD-10-CM | POA: Insufficient documentation

## 2023-05-07 ENCOUNTER — Other Ambulatory Visit: Payer: Self-pay | Admitting: Student

## 2023-05-07 NOTE — Progress Notes (Signed)
Received a message from on campus nurse that patient has had drops in blood pressure with physical therapy. Down to 73/46. Will plan to discontinue BP medication at this time and follow up with patient in 1 week to evaluate blood pressures.

## 2023-05-14 ENCOUNTER — Encounter: Payer: Self-pay | Admitting: Student

## 2023-05-14 ENCOUNTER — Ambulatory Visit: Payer: Medicare Other | Admitting: Student

## 2023-05-14 VITALS — BP 160/88 | HR 89 | Temp 97.1°F | Ht 67.5 in | Wt 121.0 lb

## 2023-05-14 DIAGNOSIS — D649 Anemia, unspecified: Secondary | ICD-10-CM | POA: Diagnosis not present

## 2023-05-14 DIAGNOSIS — R739 Hyperglycemia, unspecified: Secondary | ICD-10-CM

## 2023-05-14 DIAGNOSIS — G3184 Mild cognitive impairment, so stated: Secondary | ICD-10-CM | POA: Diagnosis not present

## 2023-05-14 DIAGNOSIS — I10 Essential (primary) hypertension: Secondary | ICD-10-CM

## 2023-05-14 DIAGNOSIS — I951 Orthostatic hypotension: Secondary | ICD-10-CM

## 2023-05-14 MED ORDER — AMLODIPINE BESYLATE 2.5 MG PO TABS
2.5000 mg | ORAL_TABLET | Freq: Every day | ORAL | 1 refills | Status: DC
Start: 2023-05-14 — End: 2023-11-07

## 2023-05-14 NOTE — Patient Instructions (Addendum)
I am sending a blood pressure medication each morning.   Please have check your blood pressure 1 hour after taking your medication and write it down on the sheet of paper each day.   Please schedule a lunch and tour of [facility].

## 2023-05-14 NOTE — Progress Notes (Unsigned)
[facility] clinic [facility].   Provider: Dr. Earnestine Mealing  Code Status: Full Code Goals of Care:     05/14/2023    3:49 PM  Advanced Directives  Does Patient Have a Medical Advance Directive? Yes  Type of Estate agent of [place];Living will  Does patient want to make changes to medical advance directive? No - Patient declined  Copy of Healthcare Power of Attorney in Chart? No - copy requested     Chief Complaint  Patient presents with   Acute Visit    Evaluate Blood Pressure. Lisinopril was stopped last Thursday.     HPI: Patient is a 82 y.o. female seen today for an acute visit for evaluate Blood Pressure. D/C Lisinopril Last Thursday  She has increased the amount of water per day she has increased to around 6 cups per day.   She has been eating more protein in her cereal. She also has some protein powder.   She has had issues remembering her medications. Daughter is present who states that she has forgotten her medications on multiple occasions. Patient denies issue at this time and states that she needs to "grow up" and "stop acting like a child" to take care of everything. Discussed her need to have consistent treatment of hypertension. She articulates the risk of low blood pressure (fall and hip fracture)  and high blood pressures (Heart attack). Discussed possibility of having an aid help with medication and/or BP checks. Also discussed the possibility of moving to assisted living to make sure she gets 3 meals per day as well as medication management.   Past Medical History:  Diagnosis Date   Anxiety    History of breast cancer    Hypertension    Sleep disturbance    Ulcerative colitis (HCC) 2003    Past Surgical History:  Procedure Laterality Date   BREAST LUMPECTOMY Right 1990's   BREAST LUMPECTOMY Right 1996   CHOLECYSTECTOMY     INGUINAL HERNIA REPAIR  2003   strangulated   LEEP  04/1997   with cervical dilation   TONSILLECTOMY       No Known Allergies  Outpatient Encounter Medications as of 05/14/2023  Medication Sig   amLODipine (NORVASC) 2.5 MG tablet Take 1 tablet (2.5 mg total) by mouth daily.   aspirin EC 81 MG tablet Take 81 mg by mouth daily. Swallow whole.   mirtazapine (REMERON) 30 MG tablet Take 1 tablet (30 mg total) by mouth at bedtime. Pt alternates with 1/2 or whole tablet   Multiple Vitamins-Minerals (ALIVE WOMENS ENERGY) TABS Take 1 tablet by mouth daily.   Multiple Vitamins-Minerals (PRESERVISION AREDS 2) CAPS Take 1 capsule by mouth daily.   rosuvastatin (CRESTOR) 20 MG tablet Take 1 tablet (20 mg total) by mouth daily.   No facility-administered encounter medications on file as of 05/14/2023.    Review of Systems:  Review of Systems  Health Maintenance  Topic Date Due   DTaP/Tdap/Td (2 - Tdap) 09/24/2015   Medicare Annual Wellness (AWV)  05/29/2022   Zoster Vaccines- Shingrix (2 of 2) 08/07/2022   COVID-19 Vaccine (8 - 2023-24 season) 02/25/2023   INFLUENZA VACCINE  04/24/2023   Pneumonia Vaccine 44+ Years old  Completed   DEXA SCAN  Completed   HPV VACCINES  Aged Out    Physical Exam: Vitals:   05/14/23 1546 05/14/23 1550  BP: (!) 176/92 (!) 160/88  Pulse: 89   Temp: (!) 97.1 F (36.2 C)   SpO2: 91%  Weight: 121 lb (54.9 kg)   Height: 5' 7.5" (1.715 m)    Body mass index is 18.67 kg/m. Physical Exam Constitutional:      Comments: thin  Cardiovascular:     Rate and Rhythm: Normal rate and regular rhythm.     Pulses: Normal pulses.     Heart sounds: Normal heart sounds.  Pulmonary:     Effort: Pulmonary effort is normal.     Breath sounds: Normal breath sounds.  Skin:    General: Skin is warm and dry.  Neurological:     Mental Status: She is alert.     Labs reviewed: Basic Metabolic Panel: Recent Labs    12/05/22 0000 05/15/23 0823  NA 137 140  K 4.1 4.1  CL 100 102  CO2 30* 28  GLUCOSE  --  83  BUN 17 13  CREATININE 0.8 0.68  CALCIUM 9.2 9.1    Liver Function Tests: Recent Labs    12/05/22 0000 05/15/23 0823  AST 14 16  ALT 3* 3*  ALKPHOS 62  --   BILITOT  --  0.5  PROT  --  6.4  ALBUMIN 4.0  --    No results for input(s): "LIPASE", "AMYLASE" in the last 8760 hours. No results for input(s): "AMMONIA" in the last 8760 hours. CBC: Recent Labs    12/05/22 0000 05/15/23 0823  WBC 5.7 6.4  NEUTROABS 4,093.00 3,898  HGB 11.5* 11.9  HCT 35* 36.0  MCV  --  94.0  PLT 220 264   Lipid Panel: No results for input(s): "CHOL", "HDL", "LDLCALC", "TRIG", "CHOLHDL", "LDLDIRECT" in the last 8760 hours. No results found for: "HGBA1C"  Procedures since last visit: No results found.  Assessment/Plan Essential hypertension, benign - Plan: amLODipine (NORVASC) 2.5 MG tablet, Complete Metabolic Panel with eGFR  Hyperglycemia - Plan: Hemoglobin A1c  Anemia, unspecified type - Plan: CBC With Differential/Platelet, Iron, TIBC and Ferritin Panel  MCI (mild cognitive impairment)  Orthostatic hypotension BP elevated today in the absence of blood pressure medication. Patient to start amlodipine 2.5 mg daily and check BP 1 hour after medications. She denies having an aid come help with medications and moving to [facility] at this time. Plans to visit [facility] for a visit. Concern for progression of memory changes no impacting overall function. F/u in 2 weeks for BP check. Discuss possibility of starting a medication that could help with mentation -- Donepezil could have BP impact and increase risk of orthostasis and Namenda could contribute to weight loss. Encourage exercise and mediterranean diet. Labs tomorrow to evaluate for poor nutritional status or secondary cause for orthostatic hypotension with CBC and CMP. Consider evaluation of neurologic vs cardiogenic causes of orthostatic hypotension (Bradycardia, valvular disease).   Labs/tests ordered:  * No order type specified * Next appt:  06/13/2023   I spent greater than 40 minutes for the care  of this patient in face to face time, chart review, clinical documentation, patient education.

## 2023-05-16 ENCOUNTER — Encounter: Payer: Self-pay | Admitting: Student

## 2023-05-16 LAB — IRON,TIBC AND FERRITIN PANEL
%SAT: 34 % (ref 16–45)
Ferritin: 102 ng/mL (ref 16–288)
Iron: 81 ug/dL (ref 45–160)
TIBC: 240 ug/dL — ABNORMAL LOW (ref 250–450)

## 2023-05-16 LAB — COMPLETE METABOLIC PANEL WITH GFR
AG Ratio: 1.7 (calc) (ref 1.0–2.5)
ALT: 3 U/L — ABNORMAL LOW (ref 6–29)
AST: 16 U/L (ref 10–35)
Albumin: 4 g/dL (ref 3.6–5.1)
Alkaline phosphatase (APISO): 65 U/L (ref 37–153)
BUN: 13 mg/dL (ref 7–25)
CO2: 28 mmol/L (ref 20–32)
Calcium: 9.1 mg/dL (ref 8.6–10.4)
Chloride: 102 mmol/L (ref 98–110)
Creat: 0.68 mg/dL (ref 0.60–0.95)
Globulin: 2.4 g/dL (ref 1.9–3.7)
Glucose, Bld: 83 mg/dL (ref 65–99)
Potassium: 4.1 mmol/L (ref 3.5–5.3)
Sodium: 140 mmol/L (ref 135–146)
Total Bilirubin: 0.5 mg/dL (ref 0.2–1.2)
Total Protein: 6.4 g/dL (ref 6.1–8.1)
eGFR: 86 mL/min/{1.73_m2} (ref >=60)

## 2023-05-16 LAB — CBC WITH DIFFERENTIAL/PLATELET
Absolute Monocytes: 454 {cells}/uL (ref 200–950)
Basophils Absolute: 58 {cells}/uL (ref 0–200)
Basophils Relative: 0.9 %
Eosinophils Absolute: 224 {cells}/uL (ref 15–500)
Eosinophils Relative: 3.5 %
HCT: 36 % (ref 35.0–45.0)
Hemoglobin: 11.9 g/dL (ref 11.7–15.5)
Lymphs Abs: 1766 {cells}/uL (ref 850–3900)
MCH: 31.1 pg (ref 27.0–33.0)
MCHC: 33.1 g/dL (ref 32.0–36.0)
MCV: 94 fL (ref 80.0–100.0)
MPV: 9.3 fL (ref 7.5–12.5)
Monocytes Relative: 7.1 %
Neutro Abs: 3898 {cells}/uL (ref 1500–7800)
Neutrophils Relative %: 60.9 %
Platelets: 264 10*3/uL (ref 140–400)
RBC: 3.83 10*6/uL (ref 3.80–5.10)
RDW: 12.5 % (ref 11.0–15.0)
Total Lymphocyte: 27.6 %
WBC: 6.4 10*3/uL (ref 3.8–10.8)

## 2023-05-16 LAB — HEMOGLOBIN A1C
Hgb A1c MFr Bld: 5.6 %{Hb} (ref <5.7)
Mean Plasma Glucose: 114 mg/dL
eAG (mmol/L): 6.3 mmol/L

## 2023-05-28 ENCOUNTER — Ambulatory Visit: Payer: Medicare Other | Admitting: Student

## 2023-05-28 ENCOUNTER — Encounter: Payer: Self-pay | Admitting: Student

## 2023-05-28 VITALS — BP 138/78 | HR 84 | Temp 96.8°F | Ht 67.5 in | Wt 122.0 lb

## 2023-05-28 DIAGNOSIS — I951 Orthostatic hypotension: Secondary | ICD-10-CM | POA: Diagnosis not present

## 2023-05-28 NOTE — Patient Instructions (Signed)
Please continue to check your blood pressure every evening at 5PM.   I've ordered an echocardiogram.  We will have the EMT come do an ECG to check on the electrical signaling in your heart.

## 2023-05-28 NOTE — Progress Notes (Signed)
Location:  [facility] clinic [facility].   Provider: Dr. Earnestine Mealing  Code Status: DNR Goals of Care:     05/28/2023    1:13 PM  Advanced Directives  Does Patient Have a Medical Advance Directive? Yes  Type of Estate agent of [place];Living will  Does patient want to make changes to medical advance directive? No - Patient declined  Copy of Healthcare Power of Attorney in Chart? No - copy requested     Chief Complaint  Patient presents with   Medical Management of Chronic Issues    Medical Management of Chronic Issues. 2 Week follow up    HPI: Patient is a 82 y.o. female seen today for medical management of chronic diseases.     Past Medical History:  Diagnosis Date   Anxiety    History of breast cancer    Hypertension    Sleep disturbance    Ulcerative colitis (HCC) 2003    Past Surgical History:  Procedure Laterality Date   BREAST LUMPECTOMY Right 1990's   BREAST LUMPECTOMY Right 1996   CHOLECYSTECTOMY     INGUINAL HERNIA REPAIR  2003   strangulated   LEEP  04/1997   with cervical dilation   TONSILLECTOMY      No Known Allergies  Outpatient Encounter Medications as of 05/28/2023  Medication Sig   amLODipine (NORVASC) 2.5 MG tablet Take 1 tablet (2.5 mg total) by mouth daily.   aspirin EC 81 MG tablet Take 81 mg by mouth daily. Swallow whole.   mirtazapine (REMERON) 30 MG tablet Take 1 tablet (30 mg total) by mouth at bedtime. Pt alternates with 1/2 or whole tablet   Multiple Vitamins-Minerals (ALIVE WOMENS ENERGY) TABS Take 1 tablet by mouth daily.   Multiple Vitamins-Minerals (PRESERVISION AREDS 2) CAPS Take 1 capsule by mouth daily.   rosuvastatin (CRESTOR) 20 MG tablet Take 1 tablet (20 mg total) by mouth daily.   No facility-administered encounter medications on file as of 05/28/2023.    Review of Systems:  Review of Systems  Health Maintenance  Topic Date Due   DTaP/Tdap/Td (2 - Tdap) 09/24/2015   Medicare Annual Wellness  (AWV)  05/29/2022   Zoster Vaccines- Shingrix (2 of 2) 08/07/2022   INFLUENZA VACCINE  04/24/2023   COVID-19 Vaccine (8 - 2023-24 season) 05/25/2023   Pneumonia Vaccine 35+ Years old  Completed   DEXA SCAN  Completed   HPV VACCINES  Aged Out    Physical Exam: Vitals:   05/28/23 1309  BP: 138/78  Pulse: 84  Temp: (!) 96.8 F (36 C)  SpO2: 97%  Weight: 122 lb (55.3 kg)  Height: 5' 7.5" (1.715 m)   Body mass index is 18.83 kg/m. Physical Exam Constitutional:      Appearance: Normal appearance.  Cardiovascular:     Rate and Rhythm: Normal rate and regular rhythm.     Pulses: Normal pulses.  Pulmonary:     Effort: Pulmonary effort is normal.     Breath sounds: Normal breath sounds.  Abdominal:     General: Abdomen is flat.     Palpations: Abdomen is soft.  Neurological:     Mental Status: She is alert.     Labs reviewed: Basic Metabolic Panel: Recent Labs    12/05/22 0000 05/15/23 0823  NA 137 140  K 4.1 4.1  CL 100 102  CO2 30* 28  GLUCOSE  --  83  BUN 17 13  CREATININE 0.8 0.68  CALCIUM 9.2 9.1  Liver Function Tests: Recent Labs    12/05/22 0000 05/15/23 0823  AST 14 16  ALT 3* 3*  ALKPHOS 62  --   BILITOT  --  0.5  PROT  --  6.4  ALBUMIN 4.0  --    No results for input(s): "LIPASE", "AMYLASE" in the last 8760 hours. No results for input(s): "AMMONIA" in the last 8760 hours. CBC: Recent Labs    12/05/22 0000 05/15/23 0823  WBC 5.7 6.4  NEUTROABS 4,093.00 3,898  HGB 11.5* 11.9  HCT 35* 36.0  MCV  --  94.0  PLT 220 264   Lipid Panel: No results for input(s): "CHOL", "HDL", "LDLCALC", "TRIG", "CHOLHDL", "LDLDIRECT" in the last 8760 hours. Lab Results  Component Value Date   HGBA1C 5.6 05/15/2023    Procedures since last visit: No results found.  Assessment/Plan Orthostatic hypotension - Plan: EKG 12-Lead, ECHOCARDIOGRAM COMPLETE Patient with persistent orthostatic hypotension with BP. Discussed concern this could be due to  underlying dementia and neurogenic OH. Discussed taking a bit more salt, wearing compression stockings, increasing water intake. Given longstanding hx of hypertension, will evaluate for valvular disease or CHF to make sure this is not due to an underlying cause. Discussed valvular replacement vs medications for CHF and additional management requirements such as daily weights, etc. Discussed fludrocortisone vs midodrine as options, but they run the risk of hypertension with laying down.   Labs/tests ordered:  * No order type specified * Next appt:  06/13/2023  I spent greater than 30 minutes for the care of this patient in face to face time, chart review, clinical documentation, patient education.

## 2023-06-06 ENCOUNTER — Ambulatory Visit: Payer: Medicare Other | Admitting: Student

## 2023-06-13 ENCOUNTER — Ambulatory Visit: Payer: Medicare Other | Admitting: Student

## 2023-06-18 ENCOUNTER — Other Ambulatory Visit: Payer: Medicare Other

## 2023-06-20 ENCOUNTER — Ambulatory Visit
Admission: RE | Admit: 2023-06-20 | Discharge: 2023-06-20 | Disposition: A | Discharge status: Home / Self Care | Payer: Medicare Other | Source: Ambulatory Visit | Attending: Student | Admitting: Student

## 2023-06-20 DIAGNOSIS — I10 Essential (primary) hypertension: Secondary | ICD-10-CM | POA: Insufficient documentation

## 2023-06-20 DIAGNOSIS — I951 Orthostatic hypotension: Secondary | ICD-10-CM | POA: Insufficient documentation

## 2023-06-20 DIAGNOSIS — R012 Other cardiac sounds: Secondary | ICD-10-CM | POA: Diagnosis not present

## 2023-06-20 LAB — ECHOCARDIOGRAM COMPLETE
AR max vel: 4.28 cm2
AV Area VTI: 4.58 cm2
AV Area mean vel: 4.49 cm2
AV Mean grad: 2 mm[Hg]
AV Peak grad: 3.5 mm[Hg]
Ao pk vel: 0.93 m/s
Area-P 1/2: 2.6 cm2
MV VTI: 3.65 cm2
S' Lateral: 1.7 cm

## 2023-06-20 NOTE — Progress Notes (Signed)
*  PRELIMINARY RESULTS* Echocardiogram 2D Echocardiogram has been performed.  Cristela Blue 06/20/2023, 9:36 AM

## 2023-06-27 ENCOUNTER — Encounter: Payer: Self-pay | Admitting: Student

## 2023-06-27 ENCOUNTER — Ambulatory Visit: Payer: Medicare Other | Admitting: Student

## 2023-06-27 VITALS — BP 138/84 | HR 89 | Temp 97.6°F | Ht 67.5 in | Wt 118.0 lb

## 2023-06-27 DIAGNOSIS — E46 Unspecified protein-calorie malnutrition: Secondary | ICD-10-CM | POA: Diagnosis not present

## 2023-06-27 DIAGNOSIS — I10 Essential (primary) hypertension: Secondary | ICD-10-CM

## 2023-06-27 DIAGNOSIS — I951 Orthostatic hypotension: Secondary | ICD-10-CM | POA: Diagnosis not present

## 2023-06-27 NOTE — Patient Instructions (Addendum)
Please make an appointment to have lunch at [facility].   You are due for your tetanus vaccine.

## 2023-06-27 NOTE — Progress Notes (Unsigned)
Location:  [facility] clinic [facility].   Provider: Dr. Earnestine Mealing  Code Status: DNR Goals of Care:     06/27/2023    2:23 PM  Advanced Directives  Does Patient Have a Medical Advance Directive? Yes  Type of Estate agent of [place];Living will  Does patient want to make changes to medical advance directive? No - Patient declined  Copy of Healthcare Power of Attorney in Chart? No - copy requested     Chief Complaint  Patient presents with   Medical Management of Chronic Issues    Medical Management of Chronic Issues. 1 Month Follow up    HPI: Patient is a 82 y.o. female seen today for medical management of chronic diseases.    She has been checking her blood pressure at home her blood pressure 131 to 120s at home. She has been able to work with therapy without issues. She is wearing compression stockings regularly .  She has had continued losing weight. Difficult to maintain her meals.   If states, "If I loose more weight before the next visit I will move to assisted living."  Her daughter is present and aids with interview.   Past Medical History:  Diagnosis Date   Anxiety    History of breast cancer    Hypertension    Sleep disturbance    Ulcerative colitis (HCC) 2003    Past Surgical History:  Procedure Laterality Date   BREAST LUMPECTOMY Right 1990's   BREAST LUMPECTOMY Right 1996   CHOLECYSTECTOMY     INGUINAL HERNIA REPAIR  2003   strangulated   LEEP  04/1997   with cervical dilation   TONSILLECTOMY      No Known Allergies  Outpatient Encounter Medications as of 06/27/2023  Medication Sig   amLODipine (NORVASC) 2.5 MG tablet Take 1 tablet (2.5 mg total) by mouth daily.   aspirin EC 81 MG tablet Take 81 mg by mouth daily. Swallow whole.   mirtazapine (REMERON) 30 MG tablet Take 1 tablet (30 mg total) by mouth at bedtime. Pt alternates with 1/2 or whole tablet   Multiple Vitamins-Minerals (ALIVE WOMENS ENERGY) TABS Take 1 tablet  by mouth daily.   Multiple Vitamins-Minerals (PRESERVISION AREDS 2) CAPS Take 1 capsule by mouth daily.   rosuvastatin (CRESTOR) 20 MG tablet Take 1 tablet (20 mg total) by mouth daily.   No facility-administered encounter medications on file as of 06/27/2023.    Review of Systems:  Review of Systems  Health Maintenance  Topic Date Due   DTaP/Tdap/Td (2 - Tdap) 09/24/2015   Medicare Annual Wellness (AWV)  05/29/2022   Zoster Vaccines- Shingrix (2 of 2) 08/07/2022   INFLUENZA VACCINE  04/24/2023   COVID-19 Vaccine (9 - 2023-24 season) 08/15/2023   Pneumonia Vaccine 65+ Years old  Completed   DEXA SCAN  Completed   HPV VACCINES  Aged Out    Physical Exam: Vitals:   06/27/23 1419  BP: 138/84  Pulse: 89  Temp: 97.6 F (36.4 C)  SpO2: 94%  Weight: 118 lb (53.5 kg)  Height: 5' 7.5" (1.715 m)   Body mass index is 18.21 kg/m. Physical Exam  Labs reviewed: Basic Metabolic Panel: Recent Labs    12/05/22 0000 05/15/23 0823  NA 137 140  K 4.1 4.1  CL 100 102  CO2 30* 28  GLUCOSE  --  83  BUN 17 13  CREATININE 0.8 0.68  CALCIUM 9.2 9.1   Liver Function Tests: Recent Labs  12/05/22 0000 05/15/23 0823  AST 14 16  ALT 3* 3*  ALKPHOS 62  --   BILITOT  --  0.5  PROT  --  6.4  ALBUMIN 4.0  --    No results for input(s): "LIPASE", "AMYLASE" in the last 8760 hours. No results for input(s): "AMMONIA" in the last 8760 hours. CBC: Recent Labs    12/05/22 0000 05/15/23 0823  WBC 5.7 6.4  NEUTROABS 4,093.00 3,898  HGB 11.5* 11.9  HCT 35* 36.0  MCV  --  94.0  PLT 220 264   Lipid Panel: No results for input(s): "CHOL", "HDL", "LDLCALC", "TRIG", "CHOLHDL", "LDLDIRECT" in the last 8760 hours. Lab Results  Component Value Date   HGBA1C 5.6 05/15/2023    Procedures since last visit: ECHOCARDIOGRAM COMPLETE  Result Date: 06/20/2023    ECHOCARDIOGRAM REPORT   Patient Name:   Vicki Randolph Date of Exam: 06/20/2023 Medical Rec #:  [id number]          Height:        67.5 in Accession #:    [id number]         Weight:       122.0 lb Date of Birth:  [birth year]          BSA:          1.648 m Patient Age:    82 years           BP:           138/78 mmHg Patient Gender: F                  HR:           84 bpm. Exam Location:  [facility] Procedure: 2D Echo, Cardiac Doppler and Color Doppler Indications:     Orthostatic hypotension I95.1                  Other cardiac sounds R01.2  History:         Patient has no prior history of Echocardiogram examinations.                  Risk Factors:Hypertension.  Sonographer:     Dorene Sorrow hege Referring Phys:  [id number] Earnestine Mealing Diagnosing Phys: Debbe Odea MD IMPRESSIONS  1. Left ventricular ejection fraction, by estimation, is 55 to 60%. The left ventricle has normal function. The left ventricle has no regional wall motion abnormalities. There is mild left ventricular hypertrophy. Left ventricular diastolic parameters are consistent with Grade I diastolic dysfunction (impaired relaxation).  2. Right ventricular systolic function is normal. The right ventricular size is normal.  3. The mitral valve is normal in structure. No evidence of mitral valve regurgitation.  4. The aortic valve was not well visualized. Aortic valve regurgitation is not visualized. FINDINGS  Left Ventricle: Left ventricular ejection fraction, by estimation, is 55 to 60%. The left ventricle has normal function. The left ventricle has no regional wall motion abnormalities. The left ventricular internal cavity size was normal in size. There is  mild left ventricular hypertrophy. Left ventricular diastolic parameters are consistent with Grade I diastolic dysfunction (impaired relaxation). Right Ventricle: The right ventricular size is normal. No increase in right ventricular wall thickness. Right ventricular systolic function is normal. Left Atrium: Left atrial size was normal in size. Right Atrium: Right atrial size was normal in size. Pericardium: There is no evidence of  pericardial effusion. Mitral Valve: The mitral valve is normal in structure. No evidence of mitral valve  regurgitation. MV peak gradient, 6.0 mmHg. The mean mitral valve gradient is 3.0 mmHg. Tricuspid Valve: The tricuspid valve is not well visualized. Tricuspid valve regurgitation is not demonstrated. Aortic Valve: The aortic valve was not well visualized. Aortic valve regurgitation is not visualized. Aortic valve mean gradient measures 2.0 mmHg. Aortic valve peak gradient measures 3.5 mmHg. Aortic valve area, by VTI measures 4.58 cm. Pulmonic Valve: The pulmonic valve was normal in structure. Pulmonic valve regurgitation is not visualized. Aorta: The aortic root is normal in size and structure. Venous: The inferior vena cava was not well visualized. IAS/Shunts: No atrial level shunt detected by color flow Doppler.  LEFT VENTRICLE PLAX 2D LVIDd:         2.30 cm   Diastology LVIDs:         1.70 cm   LV e' medial:    5.66 cm/s LV PW:         0.90 cm   LV E/e' medial:  13.7 LV IVS:        1.40 cm   LV e' lateral:   9.46 cm/s LVOT diam:     2.20 cm   LV E/e' lateral: 8.2 LV SV:         84 LV SV Index:   51 LVOT Area:     3.80 cm  RIGHT VENTRICLE RV Basal diam:  3.80 cm RV Mid diam:    2.90 cm RV S prime:     13.60 cm/s TAPSE (M-mode): 0.8 cm LEFT ATRIUM           Index        RIGHT ATRIUM           Index LA diam:      3.30 cm 2.00 cm/m   RA Area:     10.20 cm LA Vol (A2C): 27.4 ml 16.62 ml/m  RA Volume:   20.00 ml  12.13 ml/m  AORTIC VALVE AV Area (Vmax):    4.28 cm AV Area (Vmean):   4.49 cm AV Area (VTI):     4.58 cm AV Vmax:           93.20 cm/s AV Vmean:          65.000 cm/s AV VTI:            0.182 m AV Peak Grad:      3.5 mmHg AV Mean Grad:      2.0 mmHg LVOT Vmax:         105.00 cm/s LVOT Vmean:        76.800 cm/s LVOT VTI:          0.220 m LVOT/AV VTI ratio: 1.21  AORTA Ao Root diam: 3.05 cm MITRAL VALVE                TRICUSPID VALVE MV Area (PHT): 2.60 cm     TR Peak grad:   23.4 mmHg MV Area VTI:    3.65 cm     TR Vmax:        242.00 cm/s MV Peak grad:  6.0 mmHg MV Mean grad:  3.0 mmHg     SHUNTS MV Vmax:       1.22 m/s     Systemic VTI:  0.22 m MV Vmean:      75.6 cm/s    Systemic Diam: 2.20 cm MV Decel Time: 292 msec MV E velocity: 77.60 cm/s MV A velocity: 123.00 cm/s MV E/A ratio:  0.63 Debbe Odea MD Electronically signed  by Debbe Odea MD Signature Date/Time: 06/20/2023/10:14:06 AM    Final     Assessment/Plan Orthostatic hypotension  Essential hypertension, benign  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 Discussed echocardiogram with normal LV function and valvular pressures. BP within appropriate range less than 140/90. Patient continues to have weight loss. Numerous conversations regarding protein intake and meals. Discussed possible benefit of transitioning to a higher level of care to have more food intake. Patient's BMI is less than 19, 4 lb wieght loss in the last month-- previously stable at 122lbs. F/u 3 months or PRN. Patient to have telephonic AWV in the upcoming weeks.   Labs/tests ordered:  * No order type specified * Next appt:  Visit date not found

## 2023-10-01 ENCOUNTER — Encounter: Payer: Self-pay | Admitting: Student

## 2023-10-01 ENCOUNTER — Ambulatory Visit: Payer: Medicare Other | Admitting: Student

## 2023-10-01 VITALS — BP 138/86 | HR 84 | Temp 97.4°F | Ht 67.5 in | Wt 119.0 lb

## 2023-10-01 DIAGNOSIS — E46 Unspecified protein-calorie malnutrition: Secondary | ICD-10-CM | POA: Diagnosis not present

## 2023-10-01 DIAGNOSIS — Z7189 Other specified counseling: Secondary | ICD-10-CM

## 2023-10-01 DIAGNOSIS — I10 Essential (primary) hypertension: Secondary | ICD-10-CM

## 2023-10-01 DIAGNOSIS — I951 Orthostatic hypotension: Secondary | ICD-10-CM

## 2023-10-01 DIAGNOSIS — G3184 Mild cognitive impairment, so stated: Secondary | ICD-10-CM

## 2023-10-01 DIAGNOSIS — G459 Transient cerebral ischemic attack, unspecified: Secondary | ICD-10-CM

## 2023-10-01 NOTE — Progress Notes (Signed)
 Location:  [facility].   Provider: Dr. Richerd Brigham  Code Status: DNR Goals of Care:     10/01/2023    1:46 PM  Advanced Directives  Does Patient Have a Medical Advance Directive? Yes  Type of Estate Agent of [place];Out of facility DNR (pink MOST or yellow form);Living will  Does patient want to make changes to medical advance directive? No - Patient declined  Copy of Healthcare Power of Attorney in Chart? No - copy requested     Chief Complaint  Patient presents with   Medical Management of Chronic Issues    Medical Management of Chronic Issues. 3 Month Follow Up     HPI: Patient is a 83 y.o. female seen today for medical management of chronic diseases.   Discussed the use of AI scribe software for clinical note transcription with the patient, who gave verbal consent to proceed.  History of Present Illness   The patient, with a history of hypertension and transient ischemic attack, is currently living independently. The patient's daughter has expressed concerns about her mother's cognitive health and adherence to medication. The patient acknowledges some memory issues but insists that she is managing well in her current living situation. She reports taking her medications daily, including her blood pressure medication and aspirin. However, there have been instances where the patient has considered taking her medications every other day, and there have been reports of missed doses and extra pill packs piling up. The patient's daughter has suggested moving to assisted living for better medication management and social engagement, but the patient is resistant to this idea. She values her independence and solitude, and she fears the social demands and changes associated with moving to a new place.        Past Medical History:  Diagnosis Date   Anxiety    History of breast cancer    Hypertension    Sleep disturbance    Ulcerative colitis (HCC)  2003    Past Surgical History:  Procedure Laterality Date   BREAST LUMPECTOMY Right 1990's   BREAST LUMPECTOMY Right 1996   CHOLECYSTECTOMY     INGUINAL HERNIA REPAIR  2003   strangulated   LEEP  04/1997   with cervical dilation   TONSILLECTOMY      No Known Allergies  Outpatient Encounter Medications as of 10/01/2023  Medication Sig   amLODipine  (NORVASC ) 2.5 MG tablet Take 1 tablet (2.5 mg total) by mouth daily.   aspirin EC 81 MG tablet Take 81 mg by mouth daily. Swallow whole.   mirtazapine  (REMERON ) 30 MG tablet Take 1 tablet (30 mg total) by mouth at bedtime. Pt alternates with 1/2 or whole tablet   Multiple Vitamins-Minerals (ALIVE WOMENS ENERGY) TABS Take 1 tablet by mouth daily.   Multiple Vitamins-Minerals (PRESERVISION AREDS 2) CAPS Take 1 capsule by mouth daily.   rosuvastatin  (CRESTOR ) 20 MG tablet Take 1 tablet (20 mg total) by mouth daily.   No facility-administered encounter medications on file as of 10/01/2023.    Review of Systems:  Review of Systems  Health Maintenance  Topic Date Due   DTaP/Tdap/Td (2 - Tdap) 09/24/2015   Medicare Annual Wellness (AWV)  05/29/2022   Zoster Vaccines- Shingrix (2 of 2) 08/07/2022   COVID-19 Vaccine (9 - 2024-25 season) 08/15/2023   Pneumonia Vaccine 81+ Years old  Completed   INFLUENZA VACCINE  Completed   DEXA SCAN  Completed   HPV VACCINES  Aged Out  Physical Exam: Vitals:   10/01/23 1342  BP: 138/86  Pulse: 84  Temp: (!) 97.4 F (36.3 C)  SpO2: 96%  Weight: 119 lb (54 kg)  Height: 5' 7.5 (1.715 m)   Body mass index is 18.36 kg/m. Physical Exam Physical Exam          Labs reviewed: Basic Metabolic Panel: Recent Labs    12/05/22 0000 05/15/23 0823  NA 137 140  K 4.1 4.1  CL 100 102  CO2 30* 28  GLUCOSE  --  83  BUN 17 13  CREATININE 0.8 0.68  CALCIUM  9.2 9.1   Liver Function Tests: Recent Labs    12/05/22 0000 05/15/23 0823  AST 14 16  ALT 3* 3*  ALKPHOS 62  --   BILITOT  --  0.5   PROT  --  6.4  ALBUMIN 4.0  --    No results for input(s): LIPASE, AMYLASE in the last 8760 hours. No results for input(s): AMMONIA in the last 8760 hours. CBC: Recent Labs    12/05/22 0000 05/15/23 0823  WBC 5.7 6.4  NEUTROABS 4,093.00 3,898  HGB 11.5* 11.9  HCT 35* 36.0  MCV  --  94.0  PLT 220 264   Lipid Panel: No results for input(s): CHOL, HDL, LDLCALC, TRIG, CHOLHDL, LDLDIRECT in the last 8760 hours. Lab Results  Component Value Date   HGBA1C 5.6 05/15/2023    Procedures since last visit: No results found. Results   DIAGNOSTIC Cardiac testing: Normal      Assessment/Plan Assessment and Plan    Cognitive Decline Noted changes in memory and potential medication non-adherence. Discussed the potential benefits of assisted living for increased social interaction and medication management. Patient resistant to the idea of moving to assisted living. -Continue to monitor cognitive status and medication adherence. -Discuss potential for assisted living at future visits. - [place] [facility] with [place] [facility].   Hypertension Patient on Mirtazapine . Concerns raised about potential missed doses. -Continue Mirtazapine  as prescribed. -Monitor blood pressure and medication adherence.  General Health Maintenance Stable weight with a recent gain of 1 pound. Patient reports regular meals and some physical activity. -Encourage continued regular meals and physical activity. -Follow up in one month to monitor weight, cognitive status, and medication adherence.       Labs/tests ordered:  * No order type specified * Next appt:  Visit date not found  I spent greater than 50 minutes for the care of this patient in face to face time, chart review, clinical documentation, patient education.

## 2023-10-01 NOTE — Patient Instructions (Addendum)
 I'll see you in 1 month. I would like to have an update on moving to [facility]. Today, you stated you would like to move there on a trial basis.

## 2023-10-14 ENCOUNTER — Telehealth: Payer: Self-pay

## 2023-10-14 DIAGNOSIS — H04129 Dry eye syndrome of unspecified lacrimal gland: Secondary | ICD-10-CM

## 2023-10-14 DIAGNOSIS — H524 Presbyopia: Secondary | ICD-10-CM

## 2023-10-14 DIAGNOSIS — H3530 Unspecified macular degeneration: Secondary | ICD-10-CM

## 2023-10-14 DIAGNOSIS — H18519 Endothelial corneal dystrophy, unspecified eye: Secondary | ICD-10-CM

## 2023-10-14 NOTE — Telephone Encounter (Signed)
Patient left voicemail stating that she's new in town and needs new prescription for glasses. She wanted to know if you could give her some names of nearby Ophthalmologist. Message routed to PCP Earnestine Mealing, MD

## 2023-10-15 ENCOUNTER — Encounter: Payer: Self-pay | Admitting: Student

## 2023-10-15 DIAGNOSIS — H18519 Endothelial corneal dystrophy, unspecified eye: Secondary | ICD-10-CM | POA: Insufficient documentation

## 2023-10-15 DIAGNOSIS — H04129 Dry eye syndrome of unspecified lacrimal gland: Secondary | ICD-10-CM | POA: Insufficient documentation

## 2023-10-15 DIAGNOSIS — H524 Presbyopia: Secondary | ICD-10-CM | POA: Insufficient documentation

## 2023-10-15 DIAGNOSIS — H3530 Unspecified macular degeneration: Secondary | ICD-10-CM | POA: Insufficient documentation

## 2023-10-15 NOTE — Telephone Encounter (Signed)
Agree with new order for local referral. Patient was previously seen with [facility] in [place] with last visit in 2023.

## 2023-10-15 NOTE — Telephone Encounter (Signed)
Patient called again requesting a referral to an eye doctor. Patient states she is new to the area and not sure who to establish with. I pended a referral order and re-routed to Earnestine Mealing, MD

## 2023-11-07 ENCOUNTER — Encounter: Payer: Self-pay | Admitting: Student

## 2023-11-07 ENCOUNTER — Ambulatory Visit: Payer: Medicare Other | Admitting: Student

## 2023-11-07 VITALS — BP 138/84 | HR 86 | Temp 97.1°F | Ht 67.5 in | Wt 119.0 lb

## 2023-11-07 DIAGNOSIS — R739 Hyperglycemia, unspecified: Secondary | ICD-10-CM

## 2023-11-07 DIAGNOSIS — G459 Transient cerebral ischemic attack, unspecified: Secondary | ICD-10-CM | POA: Diagnosis not present

## 2023-11-07 DIAGNOSIS — F39 Unspecified mood [affective] disorder: Secondary | ICD-10-CM | POA: Diagnosis not present

## 2023-11-07 DIAGNOSIS — I10 Essential (primary) hypertension: Secondary | ICD-10-CM

## 2023-11-07 DIAGNOSIS — E43 Unspecified severe protein-calorie malnutrition: Secondary | ICD-10-CM

## 2023-11-07 MED ORDER — ROSUVASTATIN CALCIUM 20 MG PO TABS
20.0000 mg | ORAL_TABLET | Freq: Every day | ORAL | 3 refills | Status: AC
Start: 2023-11-07 — End: ?

## 2023-11-07 MED ORDER — MIRTAZAPINE 30 MG PO TABS
15.0000 mg | ORAL_TABLET | Freq: Every day | ORAL | 3 refills | Status: AC
Start: 2023-11-07 — End: ?

## 2023-11-07 MED ORDER — MIRTAZAPINE 30 MG PO TABS
15.0000 mg | ORAL_TABLET | Freq: Every day | ORAL | 3 refills | Status: DC
Start: 2023-11-07 — End: 2023-11-07

## 2023-11-07 MED ORDER — AMLODIPINE BESYLATE 2.5 MG PO TABS
2.5000 mg | ORAL_TABLET | Freq: Every day | ORAL | 1 refills | Status: AC
Start: 2023-11-07 — End: ?

## 2023-11-07 NOTE — Progress Notes (Signed)
 [facility]: [facility] Provider: Sydnee Cabal  Code Status: DNR Goals of Care:     10/01/2023    1:46 PM  Advanced Directives  Does Patient Have a Medical Advance Directive? Yes  Type of Estate agent of [place];Out of facility DNR (pink MOST or yellow form);Living will  Does patient want to make changes to medical advance directive? No - Patient declined  Copy of Healthcare Power of Attorney in Chart? No - copy requested     Chief Complaint  Patient presents with   Medical Management of Chronic Issues    Medical Management of Chronic Issues. 1 Month follow up.     HPI: Patient is a 83 y.o. female seen today for medical management of chronic diseases.   Discussed the use of AI scribe software for clinical note transcription with the patient, who gave verbal consent to proceed.  History of Present Illness   Vicki Randolph is a 83 year old female who presents for a follow-up on her weight and appetite. She is accompanied by her daughter, Elita Quick.  She endorses an overall improvement in appetite, although she does not feel hungry in the mornings. She typically consumes cereal for breakfast, which satisfies her until dinner. She often skips lunch or eats minimally, preferring to eat more in the evening. Recently, she felt very hungry before a lunch outing but only consumed a small portion, taking the remainder home.  She is currently taking mirtazapine 30 mg nightly, which aids her sleep and appetite. She has been compliant with the full dose as prescribed and has not taken half doses recently.  Her blood pressure readings have shown variability, with a morning reading of 110/90 and a higher reading of nearly 140 later in the day. She has been restarted on amlodipine due to previously high blood pressure readings during therapy sessions.  She has started receiving home care services twice a week from [facility], which includes assistance with  food preparation. She does not enjoy cooking, and the home care arrangement is intended to help with this.  She completed her physical therapy, which was discontinued when Medicare stopped covering it. She enjoyed the therapy and continues to exercise by jumping on a trampoline for 20 minutes daily.         Past Medical History:  Diagnosis Date   Anxiety    History of breast cancer    Hypertension    Sleep disturbance    Ulcerative colitis (HCC) 2003    Past Surgical History:  Procedure Laterality Date   BREAST LUMPECTOMY Right 1990's   BREAST LUMPECTOMY Right 1996   CHOLECYSTECTOMY     INGUINAL HERNIA REPAIR  2003   strangulated   LEEP  04/1997   with cervical dilation   TONSILLECTOMY      No Known Allergies  Outpatient Encounter Medications as of 11/07/2023  Medication Sig   aspirin EC 81 MG tablet Take 81 mg by mouth daily. Swallow whole.   Multiple Vitamins-Minerals (ALIVE WOMENS ENERGY) TABS Take 1 tablet by mouth daily.   Multiple Vitamins-Minerals (PRESERVISION AREDS 2) CAPS Take 1 capsule by mouth daily.   [DISCONTINUED] amLODipine (NORVASC) 2.5 MG tablet Take 1 tablet (2.5 mg total) by mouth daily.   [DISCONTINUED] mirtazapine (REMERON) 30 MG tablet Take 1 tablet (30 mg total) by mouth at bedtime. Pt alternates with 1/2 or whole tablet   [DISCONTINUED] rosuvastatin (CRESTOR) 20 MG tablet Take 1 tablet (20 mg total) by mouth  daily.   amLODipine (NORVASC) 2.5 MG tablet Take 1 tablet (2.5 mg total) by mouth daily.   mirtazapine (REMERON) 30 MG tablet Take 0.5 tablets (15 mg total) by mouth at bedtime.   rosuvastatin (CRESTOR) 20 MG tablet Take 1 tablet (20 mg total) by mouth daily.   [DISCONTINUED] mirtazapine (REMERON) 30 MG tablet Take 0.5 tablets (15 mg total) by mouth at bedtime.   No facility-administered encounter medications on file as of 11/07/2023.    Review of Systems:  Review of Systems  Health Maintenance  Topic Date Due   DTaP/Tdap/Td (2 - Tdap)  09/24/2015   Medicare Annual Wellness (AWV)  05/29/2022   Zoster Vaccines- Shingrix (2 of 2) 08/07/2022   COVID-19 Vaccine (9 - 2024-25 season) 08/15/2023   Pneumonia Vaccine 35+ Years old  Completed   INFLUENZA VACCINE  Completed   DEXA SCAN  Completed   HPV VACCINES  Aged Out    Physical Exam: Vitals:   11/07/23 1524  BP: 138/84  Pulse: 86  Temp: (!) 97.1 F (36.2 C)  SpO2: 97%  Weight: 119 lb (54 kg)  Height: 5' 7.5" (1.715 m)   Body mass index is 18.36 kg/m. Physical Exam Constitutional:      Comments: Thin, with muscle wasting of supraclavicular area, temporal and buccal muscle wasting  HENT:     Head: Normocephalic and atraumatic.  Cardiovascular:     Rate and Rhythm: Normal rate.     Pulses: Normal pulses.     Heart sounds: Normal heart sounds.  Pulmonary:     Effort: Pulmonary effort is normal.     Breath sounds: Normal breath sounds.  Abdominal:     General: Abdomen is flat.     Palpations: Abdomen is soft.  Skin:    General: Skin is warm and dry.  Neurological:     Mental Status: She is oriented to person, place, and time.    Labs reviewed: Basic Metabolic Panel: Recent Labs    12/05/22 0000 05/15/23 0823  NA 137 140  K 4.1 4.1  CL 100 102  CO2 30* 28  GLUCOSE  --  83  BUN 17 13  CREATININE 0.8 0.68  CALCIUM 9.2 9.1   Liver Function Tests: Recent Labs    12/05/22 0000 05/15/23 0823  AST 14 16  ALT 3* 3*  ALKPHOS 62  --   BILITOT  --  0.5  PROT  --  6.4  ALBUMIN 4.0  --    No results for input(s): "LIPASE", "AMYLASE" in the last 8760 hours. No results for input(s): "AMMONIA" in the last 8760 hours. CBC: Recent Labs    12/05/22 0000 05/15/23 0823  WBC 5.7 6.4  NEUTROABS 4,093.00 3,898  HGB 11.5* 11.9  HCT 35* 36.0  MCV  --  94.0  PLT 220 264   Lipid Panel: No results for input(s): "CHOL", "HDL", "LDLCALC", "TRIG", "CHOLHDL", "LDLDIRECT" in the last 8760 hours. Lab Results  Component Value Date   HGBA1C 5.6 05/15/2023     Procedures since last visit: No results found. Results   LABS Blood Pressure: 140/90 mmHg (11/07/2023)      Assessment/Plan There are no diagnoses linked to this encounter. Assessment and Plan    Appetite and Weight Management Reports improved appetite but often skips lunch after a filling breakfast and eats more at dinner. Agreed to start home care assistance twice a week for meal preparation and potential meal deliveries to ensure a balanced diet and adequate nutrition. - Initiate home  care assistance twice a week for meal preparation and potential meal deliveries - Monitor appetite and weight in follow-up visits  Depression Currently on mirtazapine 30 mg nightly, which helps with depression, sleep, and appetite. Discussed benefits of a lower dose for orexigenic effects, and she agreed to try a lower dose in the next pill pack. - Adjust mirtazapine 15 mg in the next pill pack - Monitor response to the new dose  Labile Hypertension Blood pressure readings show variability, with a reading of 110/90 in the morning and 140 in the afternoon. Currently on amlodipine 2.5, which was restarted due to previously high readings during therapy. Discussed the importance of regular monitoring and factors influencing blood pressure variability. Concern for higher dosing leading to low blood pressures.  - Continue amlodipine 2.5 mg - Monitor blood pressure regularly  General Health Maintenance Due for a tetanus vaccine. No record of previous Tdap vaccination found. Explained the importance of staying up-to-date with vaccinations. - Check with [facility] for previous Tdap vaccination record - If no record is found, register for a tetanus vaccine at the pharmacy  Follow-up - Schedule follow-up appointment in three months.       Labs/tests ordered:  * No order type specified * Next appt:  Visit date not found

## 2023-11-07 NOTE — Patient Instructions (Signed)
VISIT SUMMARY:  During today's visit, we discussed your weight and appetite, depression, and blood pressure management. You reported an improvement in appetite but noted that you often skip lunch and eat more at dinner. We also reviewed your current medications and home care services. Additionally, we discussed the importance of regular blood pressure monitoring and staying up-to-date with vaccinations.  YOUR PLAN:  -APPETITE AND WEIGHT MANAGEMENT: You have reported an improved appetite but often skip lunch after a filling breakfast and eat more at dinner. To help ensure a balanced diet and adequate nutrition, we have agreed to start home care assistance twice a week for meal preparation and potential meal deliveries. We will monitor your appetite and weight in follow-up visits.  -DEPRESSION: You are currently taking mirtazapine 30 mg nightly, which helps with depression, sleep, and appetite. We discussed the benefits of a lower dose for appetite stimulation, and you agreed to try a lower dose in the next pill pack. We will monitor your response to the new dose.  -HYPERTENSION: Your blood pressure readings have shown variability, with a reading of 110/90 in the morning and 140 in the afternoon. You are currently on amlodipine, which was restarted due to previously high readings during therapy. We discussed the importance of regular monitoring and factors influencing blood pressure variability. Please continue taking amlodipine and monitor your blood pressure regularly.  -GENERAL HEALTH MAINTENANCE: You are due for a tetanus vaccine, and there is no record of a previous Tdap vaccination. It is important to stay up-to-date with vaccinations to protect your health. Please check with [facility] for your previous Tdap vaccination record. If no record is found, register for a tetanus vaccine at the pharmacy.  INSTRUCTIONS:  Please schedule a follow-up appointment in three months.

## 2023-11-08 ENCOUNTER — Encounter: Payer: Self-pay | Admitting: Student

## 2024-02-02 ENCOUNTER — Encounter: Payer: Self-pay | Admitting: Student

## 2024-02-03 LAB — CBC WITH DIFFERENTIAL/PLATELET
Absolute Lymphocytes: 1355 {cells}/uL (ref 850–3900)
Absolute Monocytes: 362 {cells}/uL (ref 200–950)
Basophils Absolute: 49 {cells}/uL (ref 0–200)
Basophils Relative: 0.9 %
Eosinophils Absolute: 281 {cells}/uL (ref 15–500)
Eosinophils Relative: 5.2 %
HCT: 34.2 % — ABNORMAL LOW (ref 35.0–45.0)
Hemoglobin: 11.1 g/dL — ABNORMAL LOW (ref 11.7–15.5)
MCH: 30.4 pg (ref 27.0–33.0)
MCHC: 32.5 g/dL (ref 32.0–36.0)
MCV: 93.7 fL (ref 80.0–100.0)
MPV: 9 fL (ref 7.5–12.5)
Monocytes Relative: 6.7 %
Neutro Abs: 3353 {cells}/uL (ref 1500–7800)
Neutrophils Relative %: 62.1 %
Platelets: 234 10*3/uL (ref 140–400)
RBC: 3.65 10*6/uL — ABNORMAL LOW (ref 3.80–5.10)
RDW: 12.7 % (ref 11.0–15.0)
Total Lymphocyte: 25.1 %
WBC: 5.4 10*3/uL (ref 3.8–10.8)

## 2024-02-03 LAB — COMPREHENSIVE METABOLIC PANEL WITH GFR
AG Ratio: 1.7 (calc) (ref 1.0–2.5)
ALT: 4 U/L — ABNORMAL LOW (ref 6–29)
AST: 16 U/L (ref 10–35)
Albumin: 3.8 g/dL (ref 3.6–5.1)
Alkaline phosphatase (APISO): 64 U/L (ref 37–153)
BUN: 12 mg/dL (ref 7–25)
CO2: 32 mmol/L (ref 20–32)
Calcium: 8.9 mg/dL (ref 8.6–10.4)
Chloride: 104 mmol/L (ref 98–110)
Creat: 0.63 mg/dL (ref 0.60–0.95)
Globulin: 2.3 g/dL (ref 1.9–3.7)
Glucose, Bld: 85 mg/dL (ref 65–99)
Potassium: 4 mmol/L (ref 3.5–5.3)
Sodium: 141 mmol/L (ref 135–146)
Total Bilirubin: 0.4 mg/dL (ref 0.2–1.2)
Total Protein: 6.1 g/dL (ref 6.1–8.1)
eGFR: 87 mL/min/{1.73_m2} (ref >=60)

## 2024-02-03 LAB — HEMOGLOBIN A1C
Hgb A1c MFr Bld: 5.8 % — ABNORMAL HIGH (ref <5.7)
Mean Plasma Glucose: 120 mg/dL
eAG (mmol/L): 6.6 mmol/L

## 2024-02-03 LAB — VITAMIN B12: Vitamin B-12: 517 pg/mL (ref 200–1100)

## 2024-02-03 LAB — VITAMIN D 25 HYDROXY (VIT D DEFICIENCY, FRACTURES): Vit D, 25-Hydroxy: 44 ng/mL (ref 30–100)

## 2024-02-03 LAB — LIPID PANEL
Cholesterol: 141 mg/dL (ref <200)
HDL: 57 mg/dL (ref >=50)
LDL Cholesterol (Calc): 66 mg/dL
Non-HDL Cholesterol (Calc): 84 mg/dL (ref <130)
Total CHOL/HDL Ratio: 2.5 (calc) (ref <5.0)
Triglycerides: 92 mg/dL (ref <150)

## 2024-02-04 ENCOUNTER — Ambulatory Visit: Payer: Self-pay | Admitting: Student

## 2024-02-06 ENCOUNTER — Ambulatory Visit: Admitting: Student

## 2024-02-06 ENCOUNTER — Ambulatory Visit: Payer: Medicare Other | Admitting: Student

## 2024-02-13 ENCOUNTER — Ambulatory Visit: Admitting: Student

## 2024-02-13 ENCOUNTER — Encounter: Payer: Self-pay | Admitting: Student

## 2024-02-13 VITALS — BP 118/62 | HR 92 | Temp 98.3°F | Ht 67.5 in | Wt 119.0 lb

## 2024-02-13 DIAGNOSIS — G459 Transient cerebral ischemic attack, unspecified: Secondary | ICD-10-CM

## 2024-02-13 DIAGNOSIS — G3184 Mild cognitive impairment, so stated: Secondary | ICD-10-CM | POA: Diagnosis not present

## 2024-02-13 DIAGNOSIS — R7303 Prediabetes: Secondary | ICD-10-CM

## 2024-02-13 DIAGNOSIS — D508 Other iron deficiency anemias: Secondary | ICD-10-CM | POA: Diagnosis not present

## 2024-02-13 MED ORDER — IRON (FERROUS SULFATE) 325 (65 FE) MG PO TABS
325.0000 mg | ORAL_TABLET | ORAL | 3 refills | Status: AC
Start: 2024-02-13 — End: ?

## 2024-02-13 NOTE — Patient Instructions (Signed)
 VISIT SUMMARY:  During your follow-up visit, we reviewed your recent lab results and discussed ongoing health management. Your kidney function and electrolytes are stable, and your liver function tests show a slight decrease in albumin. We also discussed your anemia, prediabetes, and cholesterol management.  YOUR PLAN:  -TRANSIENT ISCHEMIC ATTACK (TIA): A TIA is a temporary period of symptoms similar to those of a stroke. It is important to continue taking Crestor  as prescribed to prevent future strokes by stabilizing plaques in your arteries.  -HYPERLIPIDEMIA: Hyperlipidemia means having high levels of fats (lipids) in your blood. Your cholesterol levels are well-controlled with Crestor , which you should continue taking once a week to prevent stroke.  -ANEMIA: Anemia is a condition where you don't have enough healthy red blood cells to carry adequate oxygen to your body's tissues. Your hemoglobin levels have decreased slightly, so continue taking your iron supplements every other day or on Monday, Wednesday, and Friday to avoid constipation. We will monitor your hemoglobin levels in future lab work.  -PREDIABETES: Prediabetes means your blood sugar levels are higher than normal but not yet high enough to be diagnosed as diabetes. Your A1c level is slightly elevated, so it's important to make dietary changes, such as consuming protein and fiber with sweets, to prevent progression to diabetes. We will monitor your A1c levels in future lab work.  INSTRUCTIONS:  Please continue taking Crestor  as prescribed and follow the recommended schedule for your iron supplements. Make dietary changes to help manage your blood sugar levels. We will monitor your hemoglobin and A1c levels in future lab work.

## 2024-02-13 NOTE — Progress Notes (Signed)
 Location:  [facility]: [facility] Provider: Jann Melody  Code Status: DNR Goals of Care:     02/13/2024    9:42 AM  Advanced Directives  Does Patient Have a Medical Advance Directive? Yes  Type of Estate agent of [place];Living will;Out of facility DNR (pink MOST or yellow form)  Does patient want to make changes to medical advance directive? No - Patient declined  Copy of Healthcare Power of Attorney in Chart? No - copy requested     Chief Complaint  Patient presents with   Medical Management of Chronic Issues    Medical Management of Chronic Issues. 3 Month follow up with Labs.     HPI: Patient is a 83 y.o. female seen today for medical management of chronic diseases.   Discussed the use of AI scribe software for clinical note transcription with the patient, who gave verbal consent to proceed.  History of Present Illness   Vicki Randolph is an 83 year old female who presents for a follow-up visit to review lab results and discuss ongoing health management. She is accompanied by her daughter.  She has a history of anemia and transient ischemic attack (TIA). Her recent lab results show stable kidney function and normal electrolytes, with sodium at 141 and potassium at 4.0. Liver function tests indicate a slight decrease in albumin from 4.0 to 3.8, while liver enzymes AST and ALT are 16 and 4, respectively.  She manages her anemia with iron supplements, taking them every other day or every few days to avoid constipation. Her hemoglobin has decreased from 11.9 in August to 11.1 currently. She uses a system to remember her iron intake by keeping the bottle in a visible place until she takes it.  Her A1c level is 5.8, slightly elevated from 5.6 in August, indicating a borderline prediabetic state. She has a sweet tooth and regularly consumes ice cream and candy, usually after meals. Her vitamin B12 level has improved from 348 in March to 512, attributed  to a vitamin supplement.  She is on Crestor  once a week for cholesterol management and stroke prevention due to her history of TIA. Her cholesterol levels are well-controlled with this regimen.  In terms of physical activity, she uses a trampoline for 15 to 20 minutes daily. She does not currently take any protein supplements but has a protein powder at home.         Past Medical History:  Diagnosis Date   Anxiety    History of breast cancer    Hypertension    Sleep disturbance    Ulcerative colitis (HCC) 2003    Past Surgical History:  Procedure Laterality Date   BREAST LUMPECTOMY Right 1990's   BREAST LUMPECTOMY Right 1996   CHOLECYSTECTOMY     INGUINAL HERNIA REPAIR  2003   strangulated   LEEP  04/1997   with cervical dilation   TONSILLECTOMY      No Known Allergies  Outpatient Encounter Medications as of 02/13/2024  Medication Sig   amLODipine  (NORVASC ) 2.5 MG tablet Take 1 tablet (2.5 mg total) by mouth daily.   aspirin EC 81 MG tablet Take 81 mg by mouth daily. Swallow whole.   Iron, Ferrous Sulfate, 325 (65 Fe) MG TABS Take 325 mg by mouth every Monday, Wednesday, and Friday.   mirtazapine  (REMERON ) 30 MG tablet Take 0.5 tablets (15 mg total) by mouth at bedtime.   Multiple Vitamins-Minerals (ALIVE WOMENS ENERGY) TABS Take 1 tablet by mouth  daily.   Multiple Vitamins-Minerals (PRESERVISION AREDS 2) CAPS Take 1 capsule by mouth daily.   rosuvastatin  (CRESTOR ) 20 MG tablet Take 1 tablet (20 mg total) by mouth daily.   No facility-administered encounter medications on file as of 02/13/2024.    Review of Systems:  Review of Systems  Health Maintenance  Topic Date Due   DTaP/Tdap/Td (2 - Tdap) 09/24/2015   Medicare Annual Wellness (AWV)  05/29/2022   Zoster Vaccines- Shingrix (2 of 2) 08/07/2022   INFLUENZA VACCINE  04/23/2024   COVID-19 Vaccine (9 - Moderna risk 2024-25 season) 07/03/2024   Pneumonia Vaccine 23+ Years old  Completed   DEXA SCAN  Completed    HPV VACCINES  Aged Out   Meningococcal B Vaccine  Aged Out    Physical Exam: Vitals:   02/13/24 0939  BP: 118/62  Pulse: 92  Temp: 98.3 F (36.8 C)  SpO2: 95%  Weight: 119 lb (54 kg)  Height: 5' 7.5" (1.715 m)   Body mass index is 18.36 kg/m. Physical Exam Constitutional:      Comments: Thin, frail, using a cane  Cardiovascular:     Pulses: Normal pulses.  Pulmonary:     Effort: Pulmonary effort is normal.  Skin:    General: Skin is warm and dry.  Neurological:     General: No focal deficit present.     Mental Status: She is alert. Mental status is at baseline.    Labs reviewed: Basic Metabolic Panel: Recent Labs    05/15/23 0823 02/02/24 0803  NA 140 141  K 4.1 4.0  CL 102 104  CO2 28 32  GLUCOSE 83 85  BUN 13 12  CREATININE 0.68 0.63  CALCIUM  9.1 8.9   Liver Function Tests: Recent Labs    05/15/23 0823 02/02/24 0803  AST 16 16  ALT 3* 4*  BILITOT 0.5 0.4  PROT 6.4 6.1   No results for input(s): "LIPASE", "AMYLASE" in the last 8760 hours. No results for input(s): "AMMONIA" in the last 8760 hours. CBC: Recent Labs    05/15/23 0823 02/02/24 0803  WBC 6.4 5.4  NEUTROABS 3,898 3,353  HGB 11.9 11.1*  HCT 36.0 34.2*  MCV 94.0 93.7  PLT 264 234   Lipid Panel: Recent Labs    02/02/24 0803  CHOL 141  HDL 57  LDLCALC 66  TRIG 92  CHOLHDL 2.5   Lab Results  Component Value Date   HGBA1C 5.8 (H) 02/02/2024    Procedures since last visit: No results found. Results   LABS Na: 141 K: 4.0 Albumin: 3.8 AST: 16 ALT: 4 Hb: 11.1 PLT: 250,000 (normal) HbA1c: 5.8 Glucose: 85 Vitamin D : 44 Vitamin B12: 517 Cholesterol: 180 (normal)      Assessment/Plan     Transient Ischemic Attack (TIA) Crestor  is used for secondary prevention by stabilizing plaques and preventing rupture. Emphasized adherence to medication regimen for stroke prevention. - Continue Crestor  as prescribed.  Hyperlipidemia Cholesterol levels are well-controlled  with current medication regimen. Crestor  is taken once a week as per previous recommendations, crucial for stroke prevention by stabilizing plaques and preventing rupture. - Continue Crestor  as prescribed.  Anemia Chronic anemia with hemoglobin decreased from 11.9 in August to 11.1 currently, not indicative of acute blood loss. Iron supplementation recommended to improve hemoglobin levels. Discussed potential side effect of constipation with daily iron intake. - Continue iron supplementation. Recommend taking iron every other day or Monday, Wednesday, Friday to avoid constipation. - Monitor hemoglobin levels in future lab  work.  Prediabetes A1c is 5.8, indicating prediabetes, previously 5.6 in August. No need for medication due to borderline levels. Discussed dietary modifications to prevent progression to diabetes, emphasizing protein and fiber intake with sweets to prevent blood sugar spikes. - Encourage dietary modifications to stabilize blood sugar levels, such as consuming protein and fiber with sweets. - Monitor A1c levels in future lab work.   Mild Cognitive Impairment  Patient receives pill packs and has an aid a few days per week for support in the home. Weight is maintained well at this time. Encourage protein supplementation.       Labs/tests ordered:  * No order type specified * Next appt:  05/19/2024  I spent greater than 30 minutes for the care of this patient in face to face time, chart review, clinical documentation, patient education.

## 2024-03-10 ENCOUNTER — Other Ambulatory Visit: Payer: Self-pay | Admitting: Student

## 2024-03-10 DIAGNOSIS — I10 Essential (primary) hypertension: Secondary | ICD-10-CM

## 2024-03-10 DIAGNOSIS — Z0000 Encounter for general adult medical examination without abnormal findings: Secondary | ICD-10-CM

## 2024-03-10 NOTE — Telephone Encounter (Signed)
 [facility] requesting refills.  Pended and sent to Dr. Jann Melody for approval.

## 2024-05-18 LAB — CBC WITH DIFFERENTIAL/PLATELET
Absolute Lymphocytes: 1531 {cells}/uL (ref 850–3900)
Absolute Monocytes: 360 {cells}/uL (ref 200–950)
Basophils Absolute: 43 {cells}/uL (ref 0–200)
Basophils Relative: 0.7 %
Eosinophils Absolute: 183 {cells}/uL (ref 15–500)
Eosinophils Relative: 3 %
HCT: 36.8 % (ref 35.0–45.0)
Hemoglobin: 11.6 g/dL — ABNORMAL LOW (ref 11.7–15.5)
MCH: 31.1 pg (ref 27.0–33.0)
MCHC: 31.5 g/dL — ABNORMAL LOW (ref 32.0–36.0)
MCV: 98.7 fL (ref 80.0–100.0)
MPV: 9 fL (ref 7.5–12.5)
Monocytes Relative: 5.9 %
Neutro Abs: 3983 {cells}/uL (ref 1500–7800)
Neutrophils Relative %: 65.3 %
Platelets: 230 Thousand/uL (ref 140–400)
RBC: 3.73 Million/uL — ABNORMAL LOW (ref 3.80–5.10)
RDW: 13.1 % (ref 11.0–15.0)
Total Lymphocyte: 25.1 %
WBC: 6.1 Thousand/uL (ref 3.8–10.8)

## 2024-05-18 LAB — IRON,TIBC AND FERRITIN PANEL
%SAT: 23 % (ref 16–45)
Ferritin: 95 ng/mL (ref 16–288)
Iron: 48 ug/dL (ref 45–160)
TIBC: 208 ug/dL — ABNORMAL LOW (ref 250–450)

## 2024-05-19 ENCOUNTER — Ambulatory Visit: Payer: Self-pay | Admitting: Student

## 2024-05-19 ENCOUNTER — Encounter: Payer: Self-pay | Admitting: Student

## 2024-05-19 ENCOUNTER — Ambulatory Visit: Admitting: Student

## 2024-05-19 VITALS — BP 126/68 | HR 91 | Temp 98.2°F | Ht 67.5 in | Wt 117.8 lb

## 2024-05-19 DIAGNOSIS — R2689 Other abnormalities of gait and mobility: Secondary | ICD-10-CM

## 2024-05-19 DIAGNOSIS — F341 Dysthymic disorder: Secondary | ICD-10-CM

## 2024-05-19 DIAGNOSIS — D508 Other iron deficiency anemias: Secondary | ICD-10-CM

## 2024-05-19 DIAGNOSIS — I10 Essential (primary) hypertension: Secondary | ICD-10-CM

## 2024-05-19 MED ORDER — ESCITALOPRAM OXALATE 10 MG PO TABS: ORAL_TABLET | ORAL | 0 refills | Status: DC

## 2024-05-19 NOTE — Progress Notes (Signed)
 SABRA

## 2024-05-19 NOTE — Patient Instructions (Signed)
 VISIT SUMMARY:  Today, we discussed your hypertension, iron  deficiency anemia, depression, and balance issues. Your blood pressure is well-controlled, but it's important to continue taking your medication consistently. We also reviewed your iron  levels and discussed ways to improve them. Additionally, we talked about your feelings of depression and potential changes to your medication. Lastly, we encouraged you to keep up with your walking to help with balance.  YOUR PLAN:  -HYPERTENSION: Hypertension means high blood pressure. Your blood pressure is currently well-controlled at 126/68 mmHg because you are taking your medication regularly. It's important to keep taking your medication every day to prevent complications like stroke. We also discussed the risk of your blood pressure dropping too low if it goes below 120 mmHg systolic.  -IRON  DEFICIENCY ANEMIA: Iron  deficiency anemia means you have low iron  levels, which can cause symptoms like tiredness and weakness. Your iron  levels have dropped from 81 to 48 micrograms per deciliter, and your hemoglobin is slightly low. It's important to take your iron  supplements on Monday, Wednesday, and Friday, and to eat more iron -rich foods like red meat, spinach, and cherries. Drinking orange juice with your supplements can help your body absorb the iron  better.  -DEPRESSION: Depression is a condition that affects your mood and interest in activities. You reported feeling less socially engaged and interested in activities. We discussed starting a new medication called Lexapro  (escitalopram ) because it has fewer side effects. We also encourage you to participate in social activities as part of your treatment plan.  -DIFFICULTY WITH BALANCE: You mentioned having some issues with balance. Continuing to walk and stay physically active can help improve your balance.  INSTRUCTIONS:  Please continue taking your blood pressure medication as prescribed. Take your iron   supplements on Monday, Wednesday, and Friday, and try to eat more iron -rich foods. Start taking Lexapro  (escitalopram ) as discussed, and gradually increase the dose. Keep up with your walking and physical activities to help with your balance. Follow up with us  in 3 months to review your progress. Part of your treatment plan is to make sure you are socializing with your weekly faith group.

## 2024-05-20 ENCOUNTER — Encounter: Payer: Self-pay | Admitting: Student

## 2024-06-23 ENCOUNTER — Encounter: Admitting: Student

## 2024-06-30 ENCOUNTER — Other Ambulatory Visit: Payer: Self-pay | Admitting: Student

## 2024-06-30 DIAGNOSIS — F341 Dysthymic disorder: Secondary | ICD-10-CM

## 2024-09-28 ENCOUNTER — Other Ambulatory Visit: Payer: Self-pay | Admitting: Nurse Practitioner

## 2024-09-28 DIAGNOSIS — F341 Dysthymic disorder: Secondary | ICD-10-CM

## 2024-10-25 ENCOUNTER — Other Ambulatory Visit: Payer: Self-pay | Admitting: Nurse Practitioner

## 2024-10-25 DIAGNOSIS — F341 Dysthymic disorder: Secondary | ICD-10-CM
